# Patient Record
Sex: Female | Born: 1975 | Race: Black or African American | Hispanic: No | Smoking: Never smoker
Health system: Southern US, Community
[De-identification: ages and names within clinical notes are randomized; demographics above are authoritative.]

## PROBLEM LIST (undated history)

## (undated) DIAGNOSIS — Z8719 Personal history of other diseases of the digestive system: Secondary | ICD-10-CM

## (undated) DIAGNOSIS — Z87440 Personal history of urinary (tract) infections: Secondary | ICD-10-CM

## (undated) DIAGNOSIS — D219 Benign neoplasm of connective and other soft tissue, unspecified: Secondary | ICD-10-CM

## (undated) DIAGNOSIS — S39012A Strain of muscle, fascia and tendon of lower back, initial encounter: Secondary | ICD-10-CM

## (undated) DIAGNOSIS — B009 Herpesviral infection, unspecified: Secondary | ICD-10-CM

## (undated) DIAGNOSIS — B379 Candidiasis, unspecified: Secondary | ICD-10-CM

## (undated) DIAGNOSIS — N760 Acute vaginitis: Principal | ICD-10-CM

## (undated) DIAGNOSIS — Z8619 Personal history of other infectious and parasitic diseases: Secondary | ICD-10-CM

## (undated) DIAGNOSIS — B9689 Other specified bacterial agents as the cause of diseases classified elsewhere: Principal | ICD-10-CM

## (undated) DIAGNOSIS — B373 Candidiasis of vulva and vagina: Secondary | ICD-10-CM

## (undated) HISTORY — DX: Strain of muscle, fascia and tendon of lower back, initial encounter: S39.012A

## (undated) HISTORY — DX: Acute vaginitis: N76.0

## (undated) HISTORY — DX: Herpesviral infection, unspecified: B00.9

## (undated) HISTORY — DX: Personal history of urinary (tract) infections: Z87.440

## (undated) HISTORY — DX: Candidiasis of vulva and vagina: B37.3

## (undated) HISTORY — PX: TUBAL LIGATION: SHX77

## (undated) HISTORY — DX: Personal history of other diseases of the digestive system: Z87.19

## (undated) HISTORY — DX: Personal history of other infectious and parasitic diseases: Z86.19

## (undated) HISTORY — DX: Candidiasis, unspecified: B37.9

## (undated) HISTORY — DX: Other specified bacterial agents as the cause of diseases classified elsewhere: B96.89

---

## 1997-06-12 ENCOUNTER — Inpatient Hospital Stay (HOSPITAL_COMMUNITY): Admission: AD | Admit: 1997-06-12 | Discharge: 1997-06-12 | Payer: Self-pay | Admitting: Obstetrics and Gynecology

## 1997-06-25 ENCOUNTER — Ambulatory Visit (HOSPITAL_COMMUNITY): Admission: RE | Admit: 1997-06-25 | Discharge: 1997-06-25 | Payer: Self-pay | Admitting: Obstetrics and Gynecology

## 1997-08-13 ENCOUNTER — Other Ambulatory Visit: Admission: RE | Admit: 1997-08-13 | Discharge: 1997-08-13 | Payer: Self-pay | Admitting: Obstetrics and Gynecology

## 1997-09-30 ENCOUNTER — Other Ambulatory Visit: Admission: RE | Admit: 1997-09-30 | Discharge: 1997-09-30 | Payer: Self-pay | Admitting: Obstetrics and Gynecology

## 1997-11-13 ENCOUNTER — Inpatient Hospital Stay (HOSPITAL_COMMUNITY): Admission: AD | Admit: 1997-11-13 | Discharge: 1997-11-15 | Payer: Self-pay | Admitting: Obstetrics

## 1997-11-13 ENCOUNTER — Inpatient Hospital Stay (HOSPITAL_COMMUNITY): Admission: AD | Admit: 1997-11-13 | Discharge: 1997-11-13 | Payer: Self-pay | Admitting: Obstetrics & Gynecology

## 2000-03-25 ENCOUNTER — Ambulatory Visit (HOSPITAL_COMMUNITY): Admission: RE | Admit: 2000-03-25 | Discharge: 2000-03-25 | Payer: Self-pay | Admitting: *Deleted

## 2001-01-23 ENCOUNTER — Other Ambulatory Visit: Admission: RE | Admit: 2001-01-23 | Discharge: 2001-01-23 | Payer: Self-pay | Admitting: Family Medicine

## 2003-02-21 ENCOUNTER — Other Ambulatory Visit: Admission: RE | Admit: 2003-02-21 | Discharge: 2003-02-21 | Payer: Self-pay | Admitting: Obstetrics and Gynecology

## 2003-04-20 DIAGNOSIS — Z8719 Personal history of other diseases of the digestive system: Secondary | ICD-10-CM

## 2003-04-20 DIAGNOSIS — B379 Candidiasis, unspecified: Secondary | ICD-10-CM

## 2003-04-20 DIAGNOSIS — B9689 Other specified bacterial agents as the cause of diseases classified elsewhere: Secondary | ICD-10-CM

## 2003-04-20 HISTORY — DX: Personal history of other diseases of the digestive system: Z87.19

## 2003-04-20 HISTORY — DX: Candidiasis, unspecified: B37.9

## 2003-04-20 HISTORY — DX: Other specified bacterial agents as the cause of diseases classified elsewhere: B96.89

## 2003-07-24 DIAGNOSIS — B9689 Other specified bacterial agents as the cause of diseases classified elsewhere: Secondary | ICD-10-CM | POA: Insufficient documentation

## 2004-02-27 ENCOUNTER — Other Ambulatory Visit: Admission: RE | Admit: 2004-02-27 | Discharge: 2004-02-27 | Payer: Self-pay | Admitting: Obstetrics and Gynecology

## 2004-04-19 DIAGNOSIS — B009 Herpesviral infection, unspecified: Secondary | ICD-10-CM

## 2004-04-19 HISTORY — DX: Herpesviral infection, unspecified: B00.9

## 2005-02-26 ENCOUNTER — Other Ambulatory Visit: Admission: RE | Admit: 2005-02-26 | Discharge: 2005-02-26 | Payer: Self-pay | Admitting: Obstetrics and Gynecology

## 2005-07-31 ENCOUNTER — Emergency Department (HOSPITAL_COMMUNITY): Admission: EM | Admit: 2005-07-31 | Discharge: 2005-07-31 | Payer: Self-pay | Admitting: Emergency Medicine

## 2005-11-29 ENCOUNTER — Emergency Department (HOSPITAL_COMMUNITY): Admission: EM | Admit: 2005-11-29 | Discharge: 2005-11-29 | Payer: Self-pay | Admitting: Family Medicine

## 2005-12-21 ENCOUNTER — Emergency Department (HOSPITAL_COMMUNITY): Admission: EM | Admit: 2005-12-21 | Discharge: 2005-12-21 | Payer: Self-pay | Admitting: Family Medicine

## 2006-11-30 ENCOUNTER — Emergency Department (HOSPITAL_COMMUNITY): Admission: EM | Admit: 2006-11-30 | Discharge: 2006-11-30 | Payer: Self-pay | Admitting: Emergency Medicine

## 2007-01-18 ENCOUNTER — Emergency Department (HOSPITAL_COMMUNITY): Admission: EM | Admit: 2007-01-18 | Discharge: 2007-01-18 | Payer: Self-pay | Admitting: Emergency Medicine

## 2007-03-21 ENCOUNTER — Emergency Department (HOSPITAL_COMMUNITY): Admission: EM | Admit: 2007-03-21 | Discharge: 2007-03-21 | Payer: Self-pay | Admitting: Emergency Medicine

## 2007-06-07 ENCOUNTER — Emergency Department (HOSPITAL_COMMUNITY): Admission: EM | Admit: 2007-06-07 | Discharge: 2007-06-07 | Payer: Self-pay | Admitting: Family Medicine

## 2008-01-28 ENCOUNTER — Emergency Department (HOSPITAL_COMMUNITY): Admission: EM | Admit: 2008-01-28 | Discharge: 2008-01-28 | Payer: Self-pay | Admitting: Family Medicine

## 2009-01-27 ENCOUNTER — Emergency Department (HOSPITAL_COMMUNITY): Admission: EM | Admit: 2009-01-27 | Discharge: 2009-01-27 | Payer: Self-pay | Admitting: Family Medicine

## 2009-04-26 ENCOUNTER — Emergency Department (HOSPITAL_COMMUNITY): Admission: EM | Admit: 2009-04-26 | Discharge: 2009-04-26 | Payer: Self-pay | Admitting: Family Medicine

## 2010-07-23 LAB — WET PREP, GENITAL
Clue Cells Wet Prep HPF POC: NONE SEEN
Trich, Wet Prep: NONE SEEN
Yeast Wet Prep HPF POC: NONE SEEN

## 2010-07-23 LAB — POCT URINALYSIS DIP (DEVICE)
Glucose, UA: NEGATIVE mg/dL
Nitrite: NEGATIVE
Specific Gravity, Urine: 1.01 (ref 1.005–1.030)
Urobilinogen, UA: 0.2 mg/dL (ref 0.0–1.0)
pH: 5.5 (ref 5.0–8.0)

## 2010-07-23 LAB — POCT PREGNANCY, URINE: Preg Test, Ur: NEGATIVE

## 2010-07-23 LAB — GC/CHLAMYDIA PROBE AMP, GENITAL
Chlamydia, DNA Probe: NEGATIVE
GC Probe Amp, Genital: NEGATIVE

## 2010-09-04 NOTE — Op Note (Signed)
[facility]  Patient:    Monique Lindsey, Monique Lindsey                    MRN: [id number] Proc. Date: 03/25/00 Adm. Date:  [id number] Disc. Date: [id number] Attending:  Pleas Koch                           Operative Report  PREOPERATIVE DIAGNOSES:       Desires elective sterilization and desires Norplant removal.  POSTOPERATIVE DIAGNOSIS:      Desires elective sterilization and desires Norplant removal.  OPERATION:                    Laparoscopic tubal cautery with Norplant removal.  SURGEON:                      Georgina Peer, M.D.  ANESTHESIA:                   General plus local injection of the skin with 0.25% Marcaine plain.  ESTIMATED BLOOD LOSS:         Less than 25 cc.  FINDINGS:                     Normal pelvis. Normal Norplant.  COMPLICATIONS:                None.  INDICATIONS:                  A 35 year old black female desired permanent sterilization. Had Norplant for contraception but because of Norplant recall, desired removal and permanent sterilization by laparoscopic cautery. She is aware of risks and complications of laparoscopy including bowel, bladder, vascular injury and also the 1% to 2% lifetime failure rate which goes along with laparoscopic tubal. She accepted these and was willing to proceed.  DESCRIPTION OF PROCEDURE:     Patient was taken to the operating room. She was placed in dorsal lithotomy position. The patient was prepped and draped in the normal sterile fashion. Bladder was emptied with a catheter. Pelvic exam was normal. Her uterine manipulator was placed in through the cervix. Other instruments removed. Vertical subumbilical incision was made and 2.5 liters of carbon dioxide gas was insufflated creating a pneumoperitoneum. Laparoscopic trocar and sleeve were introduced, under direct vision the tubes were identified, traced to their fimbriated ends. Midportion knuckle was elevated and bipolar cautery was  applied to the midportion segment among a 2-cm portion until ______ , blanching and no current passed through the tubes. This was done bilaterally and photo documentation was made. The tubes and ovaries looked normal. The uterus looked normal. The peritoneal surfaces looked normal. The bowel looked normal. The appendix was not visualized. The upper abdomen looked normal and there was no apparent injury from laparoscopic trocar placement. At the end of this procedure, the trocar was removed, Marcaine was placed in the subumbilical incision. It was closed with 4-0 subcuticular Dexon.  The patients left upper arm was then prepped and draped in the normal sterile fashion. Six Norplant cylinders were identified. Marcaine, total of 9 cc, was injected under the Norplant system and a small incision was made at the base of the cylinders and cylinders were dissected free from their capsule and excised, six in total were identified and removed. Dermabond was placed over the incision and upper arm was sterilely wrapped with Kerlix. Sponge, needle,  and instrument counts were correct. The vaginal instrument was removed and the patient returned to recovery area in stable condition. She received Toradol IV. DD:  03/25/00 TD:  03/25/00 Job: [id number]

## 2010-11-17 DIAGNOSIS — B373 Candidiasis of vulva and vagina: Secondary | ICD-10-CM

## 2010-11-17 DIAGNOSIS — B3731 Acute candidiasis of vulva and vagina: Secondary | ICD-10-CM

## 2010-11-17 DIAGNOSIS — S39012A Strain of muscle, fascia and tendon of lower back, initial encounter: Secondary | ICD-10-CM

## 2010-11-17 HISTORY — DX: Strain of muscle, fascia and tendon of lower back, initial encounter: S39.012A

## 2010-11-17 HISTORY — DX: Candidiasis of vulva and vagina: B37.3

## 2010-11-17 HISTORY — DX: Acute candidiasis of vulva and vagina: B37.31

## 2011-01-08 LAB — POCT URINALYSIS DIP (DEVICE)
Glucose, UA: NEGATIVE
Nitrite: NEGATIVE
Operator id: 239701
Urobilinogen, UA: 0.2
pH: 6

## 2011-01-19 LAB — WET PREP, GENITAL: Trich, Wet Prep: NONE SEEN

## 2011-01-19 LAB — GC/CHLAMYDIA PROBE AMP, GENITAL: Chlamydia, DNA Probe: NEGATIVE

## 2011-01-25 LAB — POCT URINALYSIS DIP (DEVICE)
Operator id: 208841
Specific Gravity, Urine: 1.02
pH: 7.5

## 2011-01-25 LAB — POCT PREGNANCY, URINE: Operator id: 235561

## 2011-01-25 LAB — GC/CHLAMYDIA PROBE AMP, GENITAL: GC Probe Amp, Genital: NEGATIVE

## 2011-01-28 LAB — GC/CHLAMYDIA PROBE AMP, GENITAL
Chlamydia, DNA Probe: NEGATIVE
GC Probe Amp, Genital: NEGATIVE

## 2011-01-28 LAB — WET PREP, GENITAL
Trich, Wet Prep: NONE SEEN
Yeast Wet Prep HPF POC: NONE SEEN

## 2011-01-28 LAB — POCT URINALYSIS DIP (DEVICE)
Bilirubin Urine: NEGATIVE
Nitrite: NEGATIVE
Operator id: 247071
Specific Gravity, Urine: <=1.005
Urobilinogen, UA: 0.2

## 2011-01-28 LAB — POCT PREGNANCY, URINE: Operator id: 239701

## 2011-06-24 ENCOUNTER — Encounter: Payer: Self-pay | Admitting: Registered Nurse

## 2011-06-30 ENCOUNTER — Encounter (INDEPENDENT_AMBULATORY_CARE_PROVIDER_SITE_OTHER): Payer: BC Managed Care – PPO | Admitting: Registered Nurse

## 2011-06-30 DIAGNOSIS — R109 Unspecified abdominal pain: Secondary | ICD-10-CM

## 2011-07-15 ENCOUNTER — Encounter (INDEPENDENT_AMBULATORY_CARE_PROVIDER_SITE_OTHER): Payer: BC Managed Care – PPO | Admitting: Registered Nurse

## 2011-07-15 DIAGNOSIS — N760 Acute vaginitis: Secondary | ICD-10-CM

## 2011-07-15 DIAGNOSIS — N72 Inflammatory disease of cervix uteri: Secondary | ICD-10-CM

## 2011-07-15 DIAGNOSIS — M545 Low back pain, unspecified: Secondary | ICD-10-CM

## 2011-07-15 DIAGNOSIS — N949 Unspecified condition associated with female genital organs and menstrual cycle: Secondary | ICD-10-CM

## 2011-07-15 DIAGNOSIS — Z13820 Encounter for screening for osteoporosis: Secondary | ICD-10-CM

## 2011-08-12 ENCOUNTER — Ambulatory Visit: Payer: BC Managed Care – PPO | Admitting: Obstetrics and Gynecology

## 2011-10-06 DIAGNOSIS — N760 Acute vaginitis: Secondary | ICD-10-CM

## 2011-10-11 ENCOUNTER — Ambulatory Visit: Payer: BC Managed Care – PPO | Admitting: Obstetrics and Gynecology

## 2011-10-11 ENCOUNTER — Other Ambulatory Visit: Payer: Self-pay | Admitting: Obstetrics and Gynecology

## 2011-10-11 NOTE — Telephone Encounter (Signed)
Triage/epic 

## 2011-10-12 ENCOUNTER — Other Ambulatory Visit: Payer: Self-pay

## 2011-10-12 MED ORDER — VALACYCLOVIR HCL 500 MG PO TABS: 500.0000 mg | ORAL_TABLET | Freq: Every day | ORAL | Status: DC

## 2011-10-12 NOTE — Telephone Encounter (Signed)
Lm on vm rx sent to pharm per protocol 

## 2011-11-09 ENCOUNTER — Encounter: Payer: Self-pay | Admitting: Obstetrics and Gynecology

## 2011-11-09 ENCOUNTER — Ambulatory Visit (INDEPENDENT_AMBULATORY_CARE_PROVIDER_SITE_OTHER): Payer: BC Managed Care – PPO | Admitting: Obstetrics and Gynecology

## 2011-11-09 VITALS — BP 100/70 | HR 74 | Ht 64.0 in | Wt 176.0 lb

## 2011-11-09 DIAGNOSIS — Z124 Encounter for screening for malignant neoplasm of cervix: Secondary | ICD-10-CM

## 2011-11-09 DIAGNOSIS — Z01419 Encounter for gynecological examination (general) (routine) without abnormal findings: Secondary | ICD-10-CM

## 2011-11-09 DIAGNOSIS — Z113 Encounter for screening for infections with a predominantly sexual mode of transmission: Secondary | ICD-10-CM

## 2011-11-09 LAB — POCT WET PREP (WET MOUNT): pH: 5.5

## 2011-11-09 MED ORDER — TINIDAZOLE 500 MG PO TABS: ORAL_TABLET | ORAL | Status: DC

## 2011-11-09 MED ORDER — TINIDAZOLE 500 MG PO TABS: 2.0000 g | ORAL_TABLET | Freq: Once | ORAL | Status: DC

## 2011-11-09 NOTE — Patient Instructions (Addendum)
Avoid: - excess soap on genital area (consider using plain oatmeal soap) - use of powder or sprays in genital area - douching - wearing underwear to bed (except with menses) - using more than is directed detergent when washing clothes - tight fitting garments around genital area - excess sugar intake  Bacterial Vaginosis Bacterial vaginosis (BV) is a vaginal infection where the normal balance of bacteria in the vagina is disrupted. The normal balance is then replaced by an overgrowth of certain bacteria. There are several different kinds of bacteria that can cause BV. BV is the most common vaginal infection in women of childbearing age. CAUSES   The cause of BV is not fully understood. BV develops when there is an increase or imbalance of harmful bacteria.   Some activities or behaviors can upset the normal balance of bacteria in the vagina and put women at increased risk including:   Having a new sex partner or multiple sex partners.   Douching.   Using an intrauterine device (IUD) for contraception.   It is not clear what role sexual activity plays in the development of BV. However, women that have never had sexual intercourse are rarely infected with BV.  Women do not get BV from toilet seats, bedding, swimming pools or from touching objects around them.  SYMPTOMS   Grey vaginal discharge.   A fish-like odor with discharge, especially after sexual intercourse.   Itching or burning of the vagina and vulva.   Burning or pain with urination.   Some women have no signs or symptoms at all.  DIAGNOSIS  Your caregiver must examine the vagina for signs of BV. Your caregiver will perform lab tests and look at the sample of vaginal fluid through a microscope. They will look for bacteria and abnormal cells (clue cells), a pH test higher than 4.5, and a positive amine test all associated with BV.  RISKS AND COMPLICATIONS   Pelvic inflammatory disease (PID).   Infections following  gynecology surgery.   Developing HIV.   Developing herpes virus.  TREATMENT  Sometimes BV will clear up without treatment. However, all women with symptoms of BV should be treated to avoid complications, especially if gynecology surgery is planned. Female partners generally do not need to be treated. However, BV may spread between female sex partners so treatment is helpful in preventing a recurrence of BV.   BV may be treated with antibiotics. The antibiotics come in either pill or vaginal cream forms. Either can be used with nonpregnant or pregnant women, but the recommended dosages differ. These antibiotics are not harmful to the baby.   BV can recur after treatment. If this happens, a second round of antibiotics will often be prescribed.   Treatment is important for pregnant women. If not treated, BV can cause a premature delivery, especially for a pregnant woman who had a premature birth in the past. All pregnant women who have symptoms of BV should be checked and treated.   For chronic reoccurrence of BV, treatment with a type of prescribed gel vaginally twice a week is helpful.  HOME CARE INSTRUCTIONS   Finish all medication as directed by your caregiver.   Do not have sex until treatment is completed.   Tell your sexual partner that you have a vaginal infection. They should see their caregiver and be treated if they have problems, such as a mild rash or itching.   Practice safe sex. Use condoms. Only have 1 sex partner.  PREVENTION  Basic   prevention steps can help reduce the risk of upsetting the natural balance of bacteria in the vagina and developing BV:  Do not have sexual intercourse (be abstinent).   Do not douche.   Use all of the medicine prescribed for treatment of BV, even if the signs and symptoms go away.   Tell your sex partner if you have BV. That way, they can be treated, if needed, to prevent reoccurrence.  SEEK MEDICAL CARE IF:   Your symptoms are not  improving after 3 days of treatment.   You have increased discharge, pain, or fever.  MAKE SURE YOU:   Understand these instructions.   Will watch your condition.   Will get help right away if you are not doing well or get worse.  FOR MORE INFORMATION  Division of STD Prevention (DSTDP), Centers for Disease Control and Prevention: www.cdc.gov/std American Social Health Association (ASHA): www.ashastd.org  Document Released: 04/05/2005 Document Revised: 03/25/2011 Document Reviewed: 09/26/2008 ExitCare Patient Information 2012 [employer]. 

## 2011-11-09 NOTE — Progress Notes (Signed)
Regular Periods: yes Mammogram: no  Monthly Breast Ex.: yes Exercise: yes  Tetanus < 10 years: no Seatbelts: yes  NI. Bladder Functn.: yes Abuse at home: no  Daily BM's: no Stressful Work: no  Healthy Diet: yes Sigmoid-Colonoscopy: NO  Calcium: yes Medical problems this year: WANT TO BE CHECKED FOR YEAST   LAST PAP:4/12  Contraception: BTL  Mammogram:  NO  PCP: NO  PMH: NO CHANGE  FMH: NO CHANGE  Last Bone Scan: NO

## 2011-11-09 NOTE — Progress Notes (Signed)
Subjective:    Monique Lindsey is a 36 y.o. female, G2P1102, who presents for an annual exam. The patient requests STD tests and wet prep for yeast though she doesn't have  any symptoms now but last week felt irritated.  Menstrual cycle:   LMP: Patient's last menstrual period was 10/20/2011.             Review of Systems Pertinent items are noted in HPI. Denies pelvic pain, urinary tract symptoms, vaginitis symptoms, irregular bleeding, menopausal symptoms, change in bowel habits or rectal bleeding   Objective:    Ht 5\' 4"  (1.626 m)  Wt 176 lb (79.833 kg)  BMI 30.21 kg/m2  LMP 10/20/2011    Wt Readings from Last 1 Encounters:  11/09/11 176 lb (79.833 kg)   Body mass index is 30.21 kg/(m^2). General Appearance: Alert, no acute distress HEENT: Grossly normal Neck / Thyroid: Supple, no thyromegaly or cervical adenopathy Lungs: Clear to auscultation bilaterally Back: No CVA tenderness Breast Exam: No masses or nodes.No dimpling, nipple retraction or discharge. Cardiovascular: Regular rate and rhythm.  Gastrointestinal: Soft, non-tender, no masses or organomegaly Pelvic Exam: EGBUS-wnl, vagina-normal rugae, cervix- without lesions or tenderness, uterus appears normal size shape and consistency, adnexae-no masses or tenderness Lymphatic Exam: Non-palpable nodes in neck, clavicular,  axillary, or inguinal regions  Skin: no rashes or abnormalities Extremities: no clubbing cyanosis or edema  Neurologic: grossly normal Psychiatric: Alert and oriented  Wet Prep: pH 5.5,  whiff-positive, clue +   Assessment:   Routine GYN Exam Bacterial Vaginosis   Plan:  STD testing  Tinidazole 500 mg # 8 4 po qd x 2 days no refills;  Etoh precautions  PAP sent  RTO 1 year or prn  Illeana Edick,ELMIRAPA-C

## 2011-11-10 LAB — RPR

## 2011-11-11 LAB — PAP IG, CT-NG, RFX HPV ASCU: Chlamydia Probe Amp: NEGATIVE

## 2011-11-28 ENCOUNTER — Other Ambulatory Visit: Payer: Self-pay | Admitting: Obstetrics and Gynecology

## 2011-12-01 ENCOUNTER — Other Ambulatory Visit: Payer: Self-pay | Admitting: Obstetrics and Gynecology

## 2011-12-01 ENCOUNTER — Other Ambulatory Visit: Payer: Self-pay

## 2011-12-01 MED ORDER — VALACYCLOVIR HCL 500 MG PO TABS: 500.0000 mg | ORAL_TABLET | Freq: Every day | ORAL | Status: AC

## 2011-12-01 NOTE — Telephone Encounter (Signed)
Lm on vm rx sent to pharm per protocol

## 2012-02-21 ENCOUNTER — Other Ambulatory Visit: Payer: Self-pay | Admitting: Obstetrics and Gynecology

## 2012-04-20 ENCOUNTER — Telehealth: Payer: Self-pay | Admitting: Obstetrics and Gynecology

## 2012-04-21 MED ORDER — VALACYCLOVIR HCL 500 MG PO TABS: 500.0000 mg | ORAL_TABLET | Freq: Every day | ORAL | Status: DC

## 2012-04-21 NOTE — Telephone Encounter (Signed)
Rx sent to pharmacy   

## 2012-06-07 ENCOUNTER — Other Ambulatory Visit: Payer: Self-pay | Admitting: Obstetrics and Gynecology

## 2012-06-07 MED ORDER — VALACYCLOVIR HCL 500 MG PO TABS
500.0000 mg | ORAL_TABLET | Freq: Every day | ORAL | Status: DC
Start: 2012-06-07 — End: 2022-09-12

## 2012-06-07 NOTE — Telephone Encounter (Signed)
rx refill for valtrex 500 mg sig 1 po qd with 1 refill.

## 2012-06-08 ENCOUNTER — Encounter: Payer: Self-pay | Admitting: Obstetrics and Gynecology

## 2012-06-08 NOTE — Progress Notes (Unsigned)
FAx received for RF Valacyclovir 500mg  1 po qd #30.Marland Kitchen Pt had annual 10/2011.  Per protocol. Rx for Rf until 11/08/12 faxed to [facility].

## 2012-06-14 ENCOUNTER — Telehealth: Payer: Self-pay | Admitting: Family Medicine

## 2012-06-15 ENCOUNTER — Encounter: Payer: Self-pay | Admitting: Family Medicine

## 2013-07-04 ENCOUNTER — Other Ambulatory Visit (HOSPITAL_COMMUNITY)
Admission: RE | Admit: 2013-07-04 | Discharge: 2013-07-04 | Disposition: A | Discharge status: Home / Self Care | Payer: BC Managed Care – PPO | Source: Ambulatory Visit | Attending: Family Medicine | Admitting: Family Medicine

## 2013-07-04 DIAGNOSIS — R87810 Cervical high risk human papillomavirus (HPV) DNA test positive: Secondary | ICD-10-CM | POA: Insufficient documentation

## 2013-07-04 DIAGNOSIS — Z124 Encounter for screening for malignant neoplasm of cervix: Secondary | ICD-10-CM | POA: Insufficient documentation

## 2013-07-04 DIAGNOSIS — Z1151 Encounter for screening for human papillomavirus (HPV): Secondary | ICD-10-CM | POA: Insufficient documentation

## 2013-12-19 ENCOUNTER — Other Ambulatory Visit: Payer: Self-pay | Admitting: Obstetrics and Gynecology

## 2013-12-19 DIAGNOSIS — D259 Leiomyoma of uterus, unspecified: Secondary | ICD-10-CM

## 2013-12-25 ENCOUNTER — Other Ambulatory Visit: Payer: Self-pay | Admitting: Obstetrics and Gynecology

## 2013-12-25 DIAGNOSIS — D259 Leiomyoma of uterus, unspecified: Secondary | ICD-10-CM

## 2013-12-27 ENCOUNTER — Ambulatory Visit
Admission: RE | Admit: 2013-12-27 | Discharge: 2013-12-27 | Disposition: A | Discharge status: Home / Self Care | Payer: BC Managed Care – PPO | Source: Ambulatory Visit | Attending: Obstetrics and Gynecology | Admitting: Obstetrics and Gynecology

## 2013-12-27 DIAGNOSIS — D259 Leiomyoma of uterus, unspecified: Secondary | ICD-10-CM

## 2013-12-27 NOTE — Consult Note (Signed)
Chief Complaint  Patient presents with  . Advice Only    Consult for [place]       Referring Physician(s): Cousins,Sheronette A  History of Present Illness:  Monique Lindsey is a 38 y.o. (G2, P2) female was initially diagnosed with uterine fibroids approximately 3 months ago pelvic ultrasound performed at [facility] on 10/17/2013. Per report, the pelvic ultrasound demonstrates multiple uterine fibroids, the largest of which measures approximately 6.7 cm and displaces the endometrium. The patient presents to interventional radiology clinic for evaluation of potential percutaneous treatment options for her symptomatically uterine fibroids. The patient is unaccompanied and serves as her own historian.  The patient states that her cycle remains regular though she has noticed worsening menorrhagia during the past year. The patient states her cycle lasts approximately 5 date, the middle 2 days of which are associated with heavy bleeding sometimes necessitating the changing of pads every 30 minutes and occasionally necessitating her having to wear 2 pads. Patient denies definitive passage of clots. She reports very minimal spotting, primarily preceding her otherwise normal cycle. Patient denies postmenopausal symptoms.  The patient does admit to bulk symptoms including bloating, right-sided lower abdominal/pelvic pain, frequent urination and occasional dysparenuria. She denies hematuria, constipation, bloody or melanotic stools.  The patient states her overall health is good. She states she has recently begun exercising and has improved her diet with subsequent loss of approximately 15-20 pounds. The patient does not desire any future children. The patient is not interested in undergoing a surgical procedure at the present time.  Note, the patient's sister underwent a myomectomy in her 79s.  Past Medical History  Diagnosis Date  . Preterm labor   . H/O varicella     Age 18  . Hx: UTI (urinary  tract infection)     Frequently  . BV (bacterial vaginosis) 2005  . H/O hemorrhoids 2005  . Monilia infection 2005  . HSV-2 infection 2006  . Candida vaginitis 11/17/10  . Back strain 11/17/10    Past Surgical History  Procedure Laterality Date  . Tubal ligation      Allergies: Review of patient's allergies indicates no known allergies.  Medications: Prior to Admission medications   Medication Sig Start Date End Date Taking? Authorizing Provider  tinidazole (TINDAMAX) 500 MG tablet 4 tablets with food and 8 ounces of water daily x 2 days 11/09/11   Earnstine Regal, PA-C  valACYclovir (VALTREX) 500 MG tablet Take 1 tablet (500 mg total) by mouth daily. 06/07/12   Ena Dawley, MD    Family History  Problem Relation Age of Onset  . Hypertension Father     History   Social History  . Marital Status: Divorced    Spouse Name: N/A    Number of Children: N/A  . Years of Education: N/A   Social History Main Topics  . Smoking status: Never Smoker   . Smokeless tobacco: Never Used  . Alcohol Use: Yes     Comment: OCCASIONAL wine    . Drug Use: No  . Sexual Activity: Yes    Birth Control/ Protection: Surgical     Comment: BTL   Other Topics Concern  . None   Social History Narrative  . None    ECOG Status: 0 - Asymptomatic  Review of Systems: A 12 point ROS discussed and pertinent positives are indicated in the HPI above.  All other systems are negative.  Review of Systems  Constitutional: Negative.   HENT: Positive for facial  swelling.   Eyes: Negative.   Respiratory: Negative.   Cardiovascular: Negative.   Gastrointestinal: Negative.   Genitourinary: Positive for frequency.  Neurological: Negative.   Psychiatric/Behavioral: Negative.     Vital Signs: Ht 5\' 4"  (1.626 m)  Wt 158 lb (71.668 kg)  BMI 27.11 kg/m2  LMP 12/06/2013  Physical Exam  Constitutional: She is oriented to person, place, and time. She appears well-developed and well-nourished.    HENT:  Head: Normocephalic and atraumatic.  Cardiovascular: Normal rate and intact distal pulses.   Pulmonary/Chest: Effort normal.  Abdominal: Soft. Bowel sounds are normal.  I am unable to palpable the uterine fundus  Neurological: She is alert and oriented to person, place, and time.  Skin: Skin is warm and dry.  Psychiatric: She has a normal mood and affect.    Imaging:  Pelvic ultrasound performed at [facility]-[id number] -- report only, no images; report demonstrates multiple uterine fibroids, the largest of which involving the posterior aspect of the uterus measures approximately 6.7 cm in greatest diameter and displaces the endometrial stripe. Ovaries are reportedly normal.  Labs:  Endometrial biopsy: Negative (performed a test dose of 2015.  Pap smear: Negative (performed 07/05/2013).  All laboratory values as of 05/24/2013 [place] otherwise specified: Creatinine -- 0.89 Hemoglobin -- 12. Hematocrit -- 35.3 Platelets -- 340  Assessment and Plan:  Monique Lindsey is a 38 y.o. (G2, P2) female was initially diagnosed with uterine fibroids approximately 3 months ago pelvic ultrasound performed at [place] on 10/17/2013. The patient admits T. symptoms attributable to her uterine fibroids including worsening menorrhagia during the past year as well as bulk symptoms including bloating, right-sided lower abdominal/pelvic pain, frequent urination and occasional dysparenuria. The patient denies perimenopausal symptoms and does not desire any future children.  Prolonged discussions were held with the patient regarding options for her symptomatic uterine fibroids including conservative management, hysterectomy and uterine fibroid embolization. At the present time, the patient is not interested in undergoing a surgical procedure, though does wish to pursue treatment for her fibroids.  As such, prolonged discussions were held with the patient regarding the benefits and risks  (including but not limited to bleeding, vessel injury, infection, nontarget embolization, contrast or radiation exposure) for uterine fibroid embolization. Following this discussion, the patient wishes to pursue a uterine fibroid embolization, likely in January of next year (as she has very little vacation or sick leave saved for the remainder of this year).  As such, I will obtain a contrast enhanced pelvic MRI likely sometime in November. Following the acquisition of this MRI, the patient will either be scheduled for a uterine fibroid embolization and/or she will return to clinic for discussion of the results of the MRI per her request.    The uterine fibroid embolization will ultimately performed at [facility] and will entail an overnight admission for PCA usage and observation.  Thank you for this interesting consult.  I greatly enjoyed meeting Monique Lindsey and look forward to participating in their care.  Ronny Bacon, MD   I spent a total of 45 minutes face to face in clinical consultation, greater than 50% of which was counseling/coordinating care for symptomatic uterine fibroids

## 2014-03-12 ENCOUNTER — Ambulatory Visit
Admission: RE | Admit: 2014-03-12 | Discharge: 2014-03-12 | Disposition: A | Discharge status: Home / Self Care | Payer: BC Managed Care – PPO | Source: Ambulatory Visit | Attending: Obstetrics and Gynecology | Admitting: Obstetrics and Gynecology

## 2014-03-12 DIAGNOSIS — D259 Leiomyoma of uterus, unspecified: Secondary | ICD-10-CM

## 2014-03-12 MED ORDER — GADOBENATE DIMEGLUMINE 529 MG/ML IV SOLN
14.0000 mL | Freq: Once | INTRAVENOUS | Status: AC | PRN
  Administered 2014-03-12: 14 mL via INTRAVENOUS

## 2014-03-20 ENCOUNTER — Telehealth: Payer: Self-pay | Admitting: Emergency Medicine

## 2014-03-20 ENCOUNTER — Telehealth: Payer: Self-pay | Admitting: Radiology

## 2014-03-20 NOTE — Telephone Encounter (Signed)
Left message requesting patient call to review MR results (pre [place] screening).  Sabrina Arriaga Riki Rusk, RN 03/20/2014 10:56 AM

## 2014-03-20 NOTE — Telephone Encounter (Signed)
PT CALLED BACK AND IS READY TO SET UP [place].  WILL CK ON HER INS. AND HAVE TIFFANY AT [facility] -[facility] TO CALL HER TO SET UP DATE AND TIME FOR PROCEDURE.

## 2014-03-22 ENCOUNTER — Other Ambulatory Visit: Payer: Self-pay | Admitting: Interventional Radiology

## 2014-03-22 DIAGNOSIS — D259 Leiomyoma of uterus, unspecified: Secondary | ICD-10-CM

## 2014-04-05 ENCOUNTER — Telehealth: Payer: Self-pay | Admitting: Emergency Medicine

## 2014-04-05 NOTE — Telephone Encounter (Signed)
error 

## 2014-04-08 ENCOUNTER — Other Ambulatory Visit: Payer: Self-pay | Admitting: Radiology

## 2014-04-09 ENCOUNTER — Other Ambulatory Visit (HOSPITAL_COMMUNITY): Payer: BC Managed Care – PPO

## 2014-04-11 ENCOUNTER — Other Ambulatory Visit: Payer: Self-pay | Admitting: Radiology

## 2014-04-15 ENCOUNTER — Observation Stay (HOSPITAL_COMMUNITY)
Admission: AD | Admit: 2014-04-15 | Discharge: 2014-04-16 | Disposition: A | Discharge status: Home / Self Care | Payer: BC Managed Care – PPO | Source: Ambulatory Visit | Attending: Interventional Radiology | Admitting: Interventional Radiology

## 2014-04-15 ENCOUNTER — Ambulatory Visit (HOSPITAL_COMMUNITY)
Admission: RE | Admit: 2014-04-15 | Discharge: 2014-04-15 | Disposition: A | Discharge status: Home / Self Care | Payer: BC Managed Care – PPO | Source: Ambulatory Visit | Attending: Interventional Radiology | Admitting: Interventional Radiology

## 2014-04-15 ENCOUNTER — Encounter (HOSPITAL_COMMUNITY): Payer: Self-pay

## 2014-04-15 VITALS — BP 87/44 | HR 68 | Temp 99.1°F | Resp 14 | Ht 64.0 in | Wt 154.0 lb

## 2014-04-15 DIAGNOSIS — Z9851 Tubal ligation status: Secondary | ICD-10-CM | POA: Diagnosis not present

## 2014-04-15 DIAGNOSIS — Z87440 Personal history of urinary (tract) infections: Secondary | ICD-10-CM | POA: Diagnosis not present

## 2014-04-15 DIAGNOSIS — Z8249 Family history of ischemic heart disease and other diseases of the circulatory system: Secondary | ICD-10-CM | POA: Insufficient documentation

## 2014-04-15 DIAGNOSIS — D251 Intramural leiomyoma of uterus: Secondary | ICD-10-CM

## 2014-04-15 DIAGNOSIS — D219 Benign neoplasm of connective and other soft tissue, unspecified: Secondary | ICD-10-CM | POA: Diagnosis present

## 2014-04-15 DIAGNOSIS — D259 Leiomyoma of uterus, unspecified: Principal | ICD-10-CM | POA: Insufficient documentation

## 2014-04-15 HISTORY — PX: UTERINE ARTERY EMBOLIZATION: SHX2629

## 2014-04-15 LAB — CBC WITH DIFFERENTIAL/PLATELET
BASOS ABS: 0 10*3/uL (ref 0.0–0.1)
BASOS PCT: 1 % (ref 0–1)
EOS PCT: 1 % (ref 0–5)
Eosinophils Absolute: 0.1 10*3/uL (ref 0.0–0.7)
HCT: 34.1 % — ABNORMAL LOW (ref 36.0–46.0)
Hemoglobin: 11.2 g/dL — ABNORMAL LOW (ref 12.0–15.0)
LYMPHS PCT: 36 % (ref 12–46)
Lymphs Abs: 2.3 10*3/uL (ref 0.7–4.0)
MCH: 28.5 pg (ref 26.0–34.0)
MCHC: 32.8 g/dL (ref 30.0–36.0)
MCV: 86.8 fL (ref 78.0–100.0)
MONO ABS: 0.5 10*3/uL (ref 0.1–1.0)
Monocytes Relative: 7 % (ref 3–12)
Neutro Abs: 3.6 10*3/uL (ref 1.7–7.7)
Neutrophils Relative %: 55 % (ref 43–77)
PLATELETS: 286 10*3/uL (ref 150–400)
RBC: 3.93 MIL/uL (ref 3.87–5.11)
RDW: 14.7 % (ref 11.5–15.5)
WBC: 6.4 10*3/uL (ref 4.0–10.5)

## 2014-04-15 LAB — BASIC METABOLIC PANEL
Anion gap: 8 (ref 5–15)
BUN: 10 mg/dL (ref 6–23)
CO2: 20 mmol/L (ref 19–32)
Calcium: 8.7 mg/dL (ref 8.4–10.5)
Chloride: 108 mEq/L (ref 96–112)
Creatinine, Ser: 0.84 mg/dL (ref 0.50–1.10)
GFR calc Af Amer: >90 mL/min (ref >90)
GFR, EST NON AFRICAN AMERICAN: 86 mL/min — AB (ref >90)
Glucose, Bld: 77 mg/dL (ref 70–99)
POTASSIUM: 3.6 mmol/L (ref 3.5–5.1)
SODIUM: 136 mmol/L (ref 135–145)

## 2014-04-15 LAB — HCG, SERUM, QUALITATIVE: PREG SERUM: NEGATIVE

## 2014-04-15 LAB — PROTIME-INR
INR: 1.04 (ref 0.00–1.49)
Prothrombin Time: 13.7 seconds (ref 11.6–15.2)

## 2014-04-15 LAB — GLUCOSE, CAPILLARY: Glucose-Capillary: 78 mg/dL (ref 70–99)

## 2014-04-15 MED ORDER — ONDANSETRON HCL 4 MG/2ML IJ SOLN
4.0000 mg | Freq: Four times a day (QID) | INTRAMUSCULAR | Status: DC | PRN
  Administered 2014-04-15 – 2014-04-16 (×2): 4 mg via INTRAVENOUS
  Filled 2014-04-15 (×2): qty 2

## 2014-04-15 MED ORDER — MIDAZOLAM HCL 2 MG/2ML IJ SOLN
INTRAMUSCULAR | Status: AC | PRN
  Administered 2014-04-15 (×3): 0.5 mg via INTRAVENOUS
  Administered 2014-04-15: 1 mg via INTRAVENOUS
  Administered 2014-04-15 (×2): 0.5 mg via INTRAVENOUS
  Administered 2014-04-15: 1 mg via INTRAVENOUS

## 2014-04-15 MED ORDER — IOHEXOL 300 MG/ML  SOLN
50.0000 mL | Freq: Once | INTRAMUSCULAR | Status: AC | PRN
  Administered 2014-04-15: 1 mL

## 2014-04-15 MED ORDER — FENTANYL CITRATE 0.05 MG/ML IJ SOLN
INTRAMUSCULAR | Status: AC
  Filled 2014-04-15: qty 4

## 2014-04-15 MED ORDER — HYDROMORPHONE 0.3 MG/ML IV SOLN
INTRAVENOUS | Status: DC
  Administered 2014-04-15: 16:00:00 via INTRAVENOUS
  Administered 2014-04-15: 0.4 mg via INTRAVENOUS
  Administered 2014-04-15: 1.59 mg via INTRAVENOUS
  Administered 2014-04-16: 1.19 mg via INTRAVENOUS
  Administered 2014-04-16: 0.2 mg via INTRAVENOUS
  Administered 2014-04-16: 0.799 mg via INTRAVENOUS

## 2014-04-15 MED ORDER — HYDROCODONE-ACETAMINOPHEN 5-325 MG PO TABS
1.0000 | ORAL_TABLET | ORAL | Status: DC | PRN
  Administered 2014-04-16 (×3): 1 via ORAL
  Filled 2014-04-15 (×3): qty 1

## 2014-04-15 MED ORDER — KETOROLAC TROMETHAMINE 30 MG/ML IJ SOLN
30.0000 mg | Freq: Once | INTRAMUSCULAR | Status: AC
  Administered 2014-04-15: 30 mg via INTRAVENOUS

## 2014-04-15 MED ORDER — LIDOCAINE HCL 1 % IJ SOLN
INTRAMUSCULAR | Status: AC
  Filled 2014-04-15: qty 20

## 2014-04-15 MED ORDER — FENTANYL CITRATE 0.05 MG/ML IJ SOLN
INTRAMUSCULAR | Status: AC | PRN
  Administered 2014-04-15: 50 ug via INTRAVENOUS
  Administered 2014-04-15 (×2): 25 ug via INTRAVENOUS

## 2014-04-15 MED ORDER — SODIUM CHLORIDE 0.9 % IJ SOLN: 9.0000 mL | INTRAMUSCULAR | Status: DC | PRN

## 2014-04-15 MED ORDER — CEFAZOLIN SODIUM-DEXTROSE 2-3 GM-% IV SOLR
2.0000 g | Freq: Once | INTRAVENOUS | Status: AC
  Administered 2014-04-15: 2 g via INTRAVENOUS

## 2014-04-15 MED ORDER — MIDAZOLAM HCL 2 MG/2ML IJ SOLN
INTRAMUSCULAR | Status: AC
  Filled 2014-04-15: qty 6

## 2014-04-15 MED ORDER — HYDROMORPHONE HCL 1 MG/ML IJ SOLN
INTRAMUSCULAR | Status: AC | PRN
  Administered 2014-04-15: 0.5 mg via INTRAVENOUS

## 2014-04-15 MED ORDER — SODIUM CHLORIDE 0.9 % IJ SOLN: 3.0000 mL | INTRAMUSCULAR | Status: DC | PRN

## 2014-04-15 MED ORDER — NALOXONE HCL 0.4 MG/ML IJ SOLN: 0.4000 mg | INTRAMUSCULAR | Status: DC | PRN

## 2014-04-15 MED ORDER — SODIUM CHLORIDE 0.9 % IV SOLN: 250.0000 mL | INTRAVENOUS | Status: DC | PRN

## 2014-04-15 MED ORDER — HYDROMORPHONE 0.3 MG/ML IV SOLN
INTRAVENOUS | Status: AC
  Filled 2014-04-15: qty 25

## 2014-04-15 MED ORDER — CEFAZOLIN SODIUM-DEXTROSE 2-3 GM-% IV SOLR
INTRAVENOUS | Status: AC
  Administered 2014-04-15: 2 g via INTRAVENOUS
  Filled 2014-04-15: qty 50

## 2014-04-15 MED ORDER — DIPHENHYDRAMINE HCL 50 MG/ML IJ SOLN: 12.5000 mg | Freq: Four times a day (QID) | INTRAMUSCULAR | Status: DC | PRN

## 2014-04-15 MED ORDER — KETOROLAC TROMETHAMINE 30 MG/ML IJ SOLN
INTRAMUSCULAR | Status: AC
  Filled 2014-04-15: qty 1

## 2014-04-15 MED ORDER — HYDROMORPHONE HCL 2 MG/ML IJ SOLN
INTRAMUSCULAR | Status: AC
  Filled 2014-04-15: qty 1

## 2014-04-15 MED ORDER — SODIUM CHLORIDE 0.9 % IJ SOLN
3.0000 mL | Freq: Two times a day (BID) | INTRAMUSCULAR | Status: DC
  Administered 2014-04-15: 3 mL via INTRAVENOUS

## 2014-04-15 MED ORDER — DIPHENHYDRAMINE HCL 12.5 MG/5ML PO ELIX
12.5000 mg | ORAL_SOLUTION | Freq: Four times a day (QID) | ORAL | Status: DC | PRN
  Filled 2014-04-15: qty 5

## 2014-04-15 MED ORDER — SODIUM CHLORIDE 0.9 % IV SOLN
INTRAVENOUS | Status: DC
  Administered 2014-04-15: 12:00:00 via INTRAVENOUS

## 2014-04-15 NOTE — Procedures (Signed)
Post UFE.  No immediate complications.  Keep right leg straight for 4 hrs.

## 2014-04-15 NOTE — H&P (Signed)
Chief Complaint: "I'm here for my fibroid procedure"  HPI: Monique Lindsey is an 38 y.o. female with symptomatic uterine fibroids. She was seen in consult for uterine artery embolization. See Dr. Pascal Lux full consult note. She has had MRI in the interim and is now scheduled for [place] procedure. She has been doing well since consult in September. Has been NPO this am PMHx, meds, imaging reviewed.  Past Medical History:  Past Medical History  Diagnosis Date  . Preterm labor   . H/O varicella     Age 48  . Hx: UTI (urinary tract infection)     Frequently  . BV (bacterial vaginosis) 2005  . H/O hemorrhoids 2005  . Monilia infection 2005  . HSV-2 infection 2006  . Candida vaginitis 11/17/10  . Back strain 11/17/10    Past Surgical History:  Past Surgical History  Procedure Laterality Date  . Tubal ligation      Family History:  Family History  Problem Relation Age of Onset  . Hypertension Father     Social History:  reports that she has never smoked. She has never used smokeless tobacco. She reports that she drinks alcohol. She reports that she does not use illicit drugs.  Allergies: No Known Allergies  Medications:   Medication List    ASK your doctor about these medications        ibuprofen 200 MG tablet  Commonly known as:  ADVIL,MOTRIN  Take 600 mg by mouth every 6 (six) hours as needed for mild pain or moderate pain.     tinidazole 500 MG tablet  Commonly known as:  TINDAMAX  4 tablets with food and 8 ounces of water daily x 2 days     valACYclovir 500 MG tablet  Commonly known as:  VALTREX  Take 1 tablet (500 mg total) by mouth daily.        Please HPI for pertinent positives, otherwise complete 10 system ROS negative.  Physical Exam: BP 113/60 mmHg  Pulse 62  Temp(Src) 98.2 F (36.8 C) (Oral)  Resp 18  SpO2 100% There is no weight on file to calculate BMI.   General Appearance:  Alert, cooperative, no distress, appears stated age  Head:   Normocephalic, without obvious abnormality, atraumatic  ENT: Unremarkable  Neck: Supple, symmetrical, trachea midline  Lungs:   Clear to auscultation bilaterally, no w/r/r, respirations unlabored without use of accessory muscles.  Heart:  Regular rate and rhythm, S1, S2 normal, no murmur, rub or gallop.  Abdomen:   Soft, non-tender, non distended.  Extremities: Extremities normal, atraumatic, no cyanosis or edema  Pulses: 2+ and symmetric femoral  Neurologic: Normal affect, no gross deficits.  Labs: Results for orders placed or performed during the hospital encounter of 04/15/14 (from the past 48 hour(s))  CBC with Differential     Status: Abnormal   Collection Time: 04/15/14 12:05 PM  Result Value Ref Range   WBC 6.4 4.0 - 10.5 K/uL   RBC 3.93 3.87 - 5.11 MIL/uL   Hemoglobin 11.2 (L) 12.0 - 15.0 g/dL   HCT 34.1 (L) 36.0 - 46.0 %   MCV 86.8 78.0 - 100.0 fL   MCH 28.5 26.0 - 34.0 pg   MCHC 32.8 30.0 - 36.0 g/dL   RDW 14.7 11.5 - 15.5 %   Platelets 286 150 - 400 K/uL   Neutrophils Relative % 55 43 - 77 %   Neutro Abs 3.6 1.7 - 7.7 K/uL   Lymphocytes Relative 36 12 - 46 %  Lymphs Abs 2.3 0.7 - 4.0 K/uL   Monocytes Relative 7 3 - 12 %   Monocytes Absolute 0.5 0.1 - 1.0 K/uL   Eosinophils Relative 1 0 - 5 %   Eosinophils Absolute 0.1 0.0 - 0.7 K/uL   Basophils Relative 1 0 - 1 %   Basophils Absolute 0.0 0.0 - 0.1 K/uL    Imaging: No results found.  Assessment/Plan Symptomatic uterine fibroids. Discussed [place] procedure, risks, complications, use of sedation.  Plan for overnight admission for observation Labs pending Consent signed in chart  Ascencion Dike PA-C 04/15/2014, 12:33 PM

## 2014-04-15 NOTE — Sedation Documentation (Signed)
5Fr sheath removed from R fem art by Dr. Pascal Lux.  Hemostasis achieved using manual pressure.  Groin level 0, 1+RDP.

## 2014-04-16 ENCOUNTER — Other Ambulatory Visit: Payer: Self-pay | Admitting: Radiology

## 2014-04-16 DIAGNOSIS — D259 Leiomyoma of uterus, unspecified: Secondary | ICD-10-CM | POA: Diagnosis not present

## 2014-04-16 DIAGNOSIS — D251 Intramural leiomyoma of uterus: Secondary | ICD-10-CM

## 2014-04-16 MED ORDER — HYDROCODONE-ACETAMINOPHEN 5-325 MG PO TABS: 1.0000 | ORAL_TABLET | ORAL | Status: DC | PRN

## 2014-04-16 MED ORDER — PROMETHAZINE HCL 12.5 MG PO TABS: 12.5000 mg | ORAL_TABLET | Freq: Four times a day (QID) | ORAL | Status: DC | PRN

## 2014-04-16 MED ORDER — DOCUSATE SODIUM 100 MG PO CAPS: 100.0000 mg | ORAL_CAPSULE | Freq: Two times a day (BID) | ORAL | Status: DC

## 2014-04-16 MED ORDER — IBUPROFEN 200 MG PO TABS: 800.0000 mg | ORAL_TABLET | Freq: Three times a day (TID) | ORAL | Status: AC

## 2014-04-16 NOTE — Discharge Summary (Signed)
Physician Discharge Summary  Patient ID: Monique Lindsey MRN: [id number] DOB/AGE: 1975/12/17 38 y.o.  Admit date: 04/15/2014 Discharge date: 04/16/2014  Admission Diagnoses: Active Problems:   Fibroids   Fibroid  Discharge Diagnoses:  Active Problems:   Fibroids   Fibroid    Procedures: Uterine artery embolization 04/15/2014  Discharged Condition: good  Hospital Course: Brought to IR suite on 12/28, underwent successful bilateral uterine artery embolization. No immediate complications. Admitted to floor in stable condition. No overnight issues, good pain control. POD #1 exam normal. Stable for discharge. Medications, instructions and follow up plan reviewed.   Consults: None   Discharge Exam: Blood pressure 87/44, pulse 68, temperature 99.1 F (37.3 C), temperature source Oral, resp. rate 14, height 5\' 4"  (1.626 m), weight 154 lb (69.854 kg), SpO2 97 %. Lungs: CTA without w/r/r Heart: Regular Abdomen: soft, NT Ext: (R)groin soft, no hematoma   Disposition: Home  Discharge Instructions    Call MD for:  difficulty breathing, headache or visual disturbances    Complete by:  As directed      Call MD for:  persistant nausea and vomiting    Complete by:  As directed      Call MD for:  redness, tenderness, or signs of infection (pain, swelling, redness, odor or green/yellow discharge around incision site)    Complete by:  As directed      Call MD for:  severe uncontrolled pain    Complete by:  As directed      Call MD for:  temperature >100.4    Complete by:  As directed      Diet - low sodium heart healthy    Complete by:  As directed      Increase activity slowly    Complete by:  As directed      May shower / Bathe    Complete by:  As directed      May walk up steps    Complete by:  As directed      No wound care    Complete by:  As directed             Medication List    TAKE these medications        docusate sodium 100 MG capsule  Commonly  known as:  COLACE  Take 1 capsule (100 mg total) by mouth 2 (two) times daily.     HYDROcodone-acetaminophen 5-325 MG per tablet  Commonly known as:  NORCO/VICODIN  Take 1-2 tablets by mouth every 4 (four) hours as needed for moderate pain.     ibuprofen 200 MG tablet  Commonly known as:  ADVIL,MOTRIN  Take 4 tablets (800 mg total) by mouth 3 (three) times daily.     promethazine 12.5 MG tablet  Commonly known as:  PHENERGAN  Take 1 tablet (12.5 mg total) by mouth every 6 (six) hours as needed for nausea or vomiting.     tinidazole 500 MG tablet  Commonly known as:  TINDAMAX  4 tablets with food and 8 ounces of water daily x 2 days     valACYclovir 500 MG tablet  Commonly known as:  VALTREX  Take 1 tablet (500 mg total) by mouth daily.           Follow-up Information    Follow up with Sandi Mariscal, MD. Go in 2 weeks.   Specialty:  Interventional Radiology   Why:  Office will call with appointment date/time. May also call [phone] with questions  Contact information:   [address] [place] [address] [phone]      Greater than 30 minutes face to face time was spent with the patient for the purpose of discharge planning, review of instructions, medications, and follow up plans.  SignedAscencion Dike PA-C 04/16/2014, 12:23 PM

## 2014-04-16 NOTE — Progress Notes (Signed)
UR completed 

## 2014-04-16 NOTE — Discharge Instructions (Signed)
Uterine Artery Embolization for Fibroids, Care After Refer to this sheet in the next few weeks. These instructions provide you with information on caring for yourself after your procedure. Your health care provider may also give you more specific instructions. Your treatment has been planned according to current medical practices, but problems sometimes occur. Call your health care provider if you have any problems or questions after your procedure. WHAT TO EXPECT AFTER THE PROCEDURE After your procedure, it is typical to have cramping in the pelvis. You will be given pain medicine to control it. HOME CARE INSTRUCTIONS  Only take over-the-counter or prescription medicines for pain, discomfort, or fever as directed by your health care provider.  Do not take aspirin. It can cause bleeding.  Follow your health care provider's advice regarding medicines given to you, diet, activity, and when to begin sexual activity.  See your health care provider for follow-up care as directed. SEEK MEDICAL CARE IF:  You have a fever.  You have redness, swelling, and pain around your incision site.  You have pus draining from your incision.  You have a rash. SEEK IMMEDIATE MEDICAL CARE IF:  You have bleeding from your incision site.  You have difficulty breathing.  You have chest pain.  You have severe abdominal pain.  You have leg pain.  You become dizzy and faint. Document Released: 01/24/2013 Document Reviewed: 01/24/2013 [facility] Patient Information 2015 [place], [place]. This information is not intended to replace advice given to you by your health care provider. Make sure you discuss any questions you have with your health care provider. Uterine Artery Embolization for Fibroids Uterine artery embolization is a nonsurgical treatment to shrink fibroids. A thin plastic tube (catheter) is used to inject material that blocks off the blood supply to the fibroid, which causes the fibroid to  shrink. LET [facility] CARE PROVIDER KNOW ABOUT:  Any allergies you have.  All medicines you are taking, including vitamins, herbs, eye drops, creams, and over-the-counter medicines.  Previous problems you or members of your family have had with the use of anesthetics.  Any blood disorders you have.  Previous surgeries you have had.  Medical conditions you have. RISKS AND COMPLICATIONS  Injury to the uterus from decreased blood supply  Infection.  Blood infection (septicemia).  Lack of menstrual periods (amenorrhea).  Death of tissue cells (necrosis) around your bladder or vulva.  Development of a hole between organs or from an organ to the surface of your skin (fistula).  Blood clot in the legs (deep vein thrombosis) or lung (pulmonary embolus). BEFORE THE PROCEDURE  Ask your health care provider about changing or stopping your regular medicines.   Do not take aspirin or blood thinners (anticoagulants) for 1 week before the surgery or as directed by your health care provider.  Do not eat or drink anything for 8 hours before the surgery or as directed by your health care provider.   Empty your bladder before the procedure begins. PROCEDURE   An IV tube will be placed into one of your veins. This will be used to give you a sedative and pain medication (conscious sedation).  You will be given a medicine that numbs the area (local anesthetic).  A small cut will be made in your groin. A catheter is then inserted into the main artery of your leg.  The catheter will be guided through the artery to your uterus. A series of images will be taken while dye is injected through the catheter in your  groin. X-rays are taken at the same time. This is done to provide a road map of the blood supply to your uterus and fibroids.  Tiny plastic spheres, about the size of sand grains, will be injected through the catheter. Metal coils may be used to help block the artery. The particles  will lodge in tiny branches of the uterine artery that supplies blood to the fibroids.  The procedure is repeated on the artery that supplies the other side of the uterus.  The catheter is then removed and pressure is held to stop any bleeding. No stitches are needed.  A dressing is then placed over the cut (incision). AFTER THE PROCEDURE  You will be taken to a recovery area where your progress will be monitored until you are awake, stable, and taking fluids well. If there are no other problems, you will then be moved to a regular hospital room.  You will be observed overnight in the hospital.  You will have cramping that should be controlled with pain medication. Document Released: 06/21/2005 Document Revised: 01/24/2013 Document Reviewed: 10/19/2012 [facility] Patient Information 2015 [place], [place]. This information is not intended to replace advice given to you by your health care provider. Make sure you discuss any questions you have with your health care provider.

## 2014-04-16 NOTE — Progress Notes (Signed)
Doing well this am. Some nausea and mild flank/pelvis pain BP 99/54 mmHg  Pulse 68  Temp(Src) 97.7 F (36.5 C) (Oral)  Resp 14  Ht 5\' 4"  (1.626 m)  Wt 154 lb (69.854 kg)  BMI 26.42 kg/m2  SpO2 100% Abd: soft, mildly tender (R)groin site NT, no hematoma  DC PCA Increase activity  DC home later this am.  Ascencion Dike PA-C Interventional Radiology 04/16/2014 9:12 AM

## 2014-04-25 ENCOUNTER — Encounter: Payer: Self-pay | Admitting: Radiology

## 2014-04-25 ENCOUNTER — Ambulatory Visit
Admission: RE | Admit: 2014-04-25 | Discharge: 2014-04-25 | Disposition: A | Discharge status: Home / Self Care | Payer: BLUE CROSS/BLUE SHIELD | Source: Ambulatory Visit | Attending: Radiology | Admitting: Radiology

## 2014-04-25 DIAGNOSIS — D251 Intramural leiomyoma of uterus: Secondary | ICD-10-CM

## 2014-04-25 HISTORY — PX: IR GENERIC HISTORICAL: IMG1180011

## 2014-04-25 HISTORY — DX: Benign neoplasm of connective and other soft tissue, unspecified: D21.9

## 2014-04-25 NOTE — Progress Notes (Signed)
Patient ID: Monique Lindsey, female   DOB: 1975-06-21, 39 y.o.   MRN: [id number]   Chief Complaint: Chief Complaint  Patient presents with  . Follow-up    10 follow up [place]      Referring Physician(s): Garwin Brothers, Sheronetta A  History of Present Illness: Monique Lindsey is a 39 y.o. female (G2, P2) who was initially diagnosed with uterine fibroids during pelvic ultrasound performed at [facility] on 10/17/2013 for the workup of menorrhagia. Patient was initially seen in consultation at the interventional radiology clinic on 03/12/2014 at which time she admitted to worsening menorrhagia, the middle 2 days of which are associate with heavy bleeding, necessitating the changing of pads every 30 minutes and occasionally having to wear 2 pads the same time. She also admitted to bulk-like symptoms including bloating, right sided lower abdominal pain, urinary frequency and occasional dysparenuria. At that time, the patient was not interested in undergoing a surgical procedure and as such underwent successful bilateral uterine artery embolization on 04/15/2014 and returns today for postprocedural consultation. She is unaccompanied and serves as her own historian.  The patient has recovered fully from the procedure and desires to go back to work.  She states she may have had a cycle approximately 3 days following the procedure though states this was associated with markedly last menorrhagia. She admits to a very minimal intermittent spotting. Patient states she experiences transient right-sided lower abdominal/pelvic pain though this has been well-tolerated and has not required the use of any prescribed narcotics. Patient denies dysuria or hematuria. No change in bowel function.  Past Medical History  Diagnosis Date  . Preterm labor   . H/O varicella     Age 48  . Hx: UTI (urinary tract infection)     Frequently  . BV (bacterial vaginosis) 2005  . H/O hemorrhoids 2005  . Monilia infection 2005  .  HSV-2 infection 2006  . Candida vaginitis 11/17/10  . Back strain 11/17/10  . Fibroids     Past Surgical History  Procedure Laterality Date  . Tubal ligation    . Uterine artery embolization  04/15/2014    Allergies: Review of patient's allergies indicates no known allergies.  Medications: Prior to Admission medications   Medication Sig Start Date End Date Taking? Authorizing Provider  ibuprofen (ADVIL,MOTRIN) 200 MG tablet Take 4 tablets (800 mg total) by mouth 3 (three) times daily. 04/16/14  Yes Ascencion Dike, PA-C  docusate sodium (COLACE) 100 MG capsule Take 1 capsule (100 mg total) by mouth 2 (two) times daily. Patient not taking: Reported on 04/25/2014 04/16/14   Ascencion Dike, PA-C  HYDROcodone-acetaminophen (NORCO/VICODIN) 5-325 MG per tablet Take 1-2 tablets by mouth every 4 (four) hours as needed for moderate pain. Patient not taking: Reported on 04/25/2014 04/16/14   Ascencion Dike, PA-C  promethazine (PHENERGAN) 12.5 MG tablet Take 1 tablet (12.5 mg total) by mouth every 6 (six) hours as needed for nausea or vomiting. Patient not taking: Reported on 04/25/2014 04/16/14   Ascencion Dike, PA-C  tinidazole (TINDAMAX) 500 MG tablet 4 tablets with food and 8 ounces of water daily x 2 days Patient not taking: Reported on 04/08/2014 11/09/11   Earnstine Regal, PA-C  valACYclovir (VALTREX) 500 MG tablet Take 1 tablet (500 mg total) by mouth daily. Patient not taking: Reported on 04/08/2014 06/07/12   Ena Dawley, MD    Family History  Problem Relation Age of Onset  . Hypertension Father     History   Social History  .  Marital Status: Divorced    Spouse Name: N/A    Number of Children: N/A  . Years of Education: N/A   Social History Main Topics  . Smoking status: Never Smoker   . Smokeless tobacco: Never Used  . Alcohol Use: Yes     Comment: OCCASIONAL wine    . Drug Use: No  . Sexual Activity: Yes    Birth Control/ Protection: Surgical     Comment: BTL   Other Topics  Concern  . Not on file   Social History Narrative    ECOG Status: 0 - Asymptomatic  Review of Systems: A 12 point ROS discussed and pertinent positives are indicated in the HPI above.  All other systems are negative.  Review of Systems  Constitutional: Negative.   Respiratory: Negative.   Cardiovascular: Negative.   Gastrointestinal: Negative.   Genitourinary: Negative for dysuria, frequency, hematuria, flank pain and difficulty urinating.       Pt admits to intermittent spotting.    Vital Signs: BP 112/76 mmHg  Pulse 72  Temp(Src) 98.2 F (36.8 C) (Oral)  Resp 15  SpO2 100%  Physical Exam  Constitutional: She appears well-developed and well-nourished.  Cardiovascular: Normal rate, regular rhythm and intact distal pulses.   Pulmonary/Chest: Effort normal.  Abdominal: Soft.  Genitourinary:  I am unable to palpate the uterine fundus.   Skin: Skin is warm and dry.  Well healed right groin access site.  Psychiatric: She has a normal mood and affect.    Imaging:  Selective images from preprocedural contrast enhanced pelvic MRI performed 03/12/2014 as well as angiographic images from bilateral uterine artery embolization performed 04/15/2014 reviewed with the patient.  Labs:  CBC:  Recent Labs  04/15/14 1205  WBC 6.4  HGB 11.2*  HCT 34.1*  PLT 286    COAGS:  Recent Labs  04/15/14 1205  INR 1.04    BMP:  Recent Labs  04/15/14 1205  NA 136  K 3.6  CL 108  CO2 20  GLUCOSE 77  BUN 10  CALCIUM 8.7  CREATININE 0.84  GFRNONAA 86*  GFRAA >90    LIVER FUNCTION TESTS: No results for input(s): BILITOT, AST, ALT, ALKPHOS, PROT, ALBUMIN in the last 8760 hours.  TUMOR MARKERS: No results for input(s): AFPTM, CEA, CA199, CHROMGRNA in the last 8760 hours.  Assessment and Plan:  Monique Lindsey is a 39 y.o. female (G2, P2) with past medical history significant for symptomatic uterine fibroids including bulk-like symptoms as well as severe  menorrhagia who underwent a technically successful bilateral uterine artery embolization on 04/15/2014 and returns today for postprocedural consultation.  The patient has recovered fully from the procedure and desires to go back to work for which she may without limitation.   Selective images from preprocedural contrast enhanced pelvic MRI performed 03/12/2014 as well as angiographic images from bilateral uterine artery embolization performed 04/15/2014 reviewed with the patient.  The patient states she may have all ready experienced a cycle occurring approximately 2-3 days following the procedure, for which she feels she has already noticed a marked improvement in her menorrhagia. I explained to the patient that her subsequent cycles may be somewhat variable, potentially with persistent menorrhagia or potentially intermittent spotting, though that in the coming 3-6 months, I would expect a marked improvement in her menorrhagia as well as a slow improvement in her bulk symptoms.  The patient will be contacted by phone in approximately 3 months to ensure continued subjective improvement.  She will be  seen in follow-up consultation in approximately 6 months (June or July 2016).  Follow-up imaging will only be obtained if the patient is experiencing persistent symptoms.  Ronny Bacon, MD   I spent a total of 25 minutes face to face in clinical consultation, greater than 50% of which was counseling/coordinating care for symptomatically uterine fibroids.  SignedSandi Mariscal 04/25/2014, 5:13 PM

## 2014-04-25 NOTE — Progress Notes (Signed)
10 days post [place].    Minimal spotting.  Minimal cramping.  Has resumed normal activities.    Plans to return to work on 04/29/2014.  Doing great!  Alonni Heimsoth Riki Rusk, RN 04/25/2014 3:40 PM Reece Levy, RN 04/25/2014 3:40 PM

## 2014-07-09 ENCOUNTER — Telehealth: Payer: Self-pay | Admitting: Radiology

## 2014-07-09 NOTE — Telephone Encounter (Signed)
3 mo s/p [place] call.  Left message requesting patient to call for status update.  Dayla Gasca [place], [place] 07/09/2014 11:22 AM

## 2014-10-04 ENCOUNTER — Other Ambulatory Visit (HOSPITAL_COMMUNITY): Payer: Self-pay | Admitting: Interventional Radiology

## 2014-10-04 DIAGNOSIS — D251 Intramural leiomyoma of uterus: Secondary | ICD-10-CM

## 2014-10-08 ENCOUNTER — Other Ambulatory Visit: Payer: BLUE CROSS/BLUE SHIELD

## 2015-01-01 ENCOUNTER — Other Ambulatory Visit: Payer: Self-pay

## 2015-01-01 DIAGNOSIS — Z1231 Encounter for screening mammogram for malignant neoplasm of breast: Secondary | ICD-10-CM

## 2015-01-13 ENCOUNTER — Ambulatory Visit
Admission: RE | Admit: 2015-01-13 | Discharge: 2015-01-13 | Disposition: A | Discharge status: Home / Self Care | Payer: BLUE CROSS/BLUE SHIELD | Source: Ambulatory Visit

## 2015-01-13 DIAGNOSIS — Z1231 Encounter for screening mammogram for malignant neoplasm of breast: Secondary | ICD-10-CM

## 2016-02-05 ENCOUNTER — Other Ambulatory Visit: Payer: Self-pay | Admitting: Family Medicine

## 2016-02-05 DIAGNOSIS — Z1231 Encounter for screening mammogram for malignant neoplasm of breast: Secondary | ICD-10-CM

## 2016-02-17 ENCOUNTER — Ambulatory Visit
Admission: RE | Admit: 2016-02-17 | Discharge: 2016-02-17 | Disposition: A | Discharge status: Home / Self Care | Payer: BLUE CROSS/BLUE SHIELD | Source: Ambulatory Visit | Attending: Family Medicine | Admitting: Family Medicine

## 2016-02-17 DIAGNOSIS — Z1231 Encounter for screening mammogram for malignant neoplasm of breast: Secondary | ICD-10-CM

## 2016-04-21 ENCOUNTER — Encounter: Payer: Self-pay | Admitting: Interventional Radiology

## 2016-10-26 ENCOUNTER — Ambulatory Visit (HOSPITAL_COMMUNITY)
Admission: RE | Admit: 2016-10-26 | Discharge: 2016-10-26 | Disposition: A | Discharge status: Home / Self Care | Payer: BLUE CROSS/BLUE SHIELD | Source: Ambulatory Visit | Attending: Vascular Surgery | Admitting: Vascular Surgery

## 2016-10-26 ENCOUNTER — Other Ambulatory Visit (HOSPITAL_COMMUNITY): Payer: Self-pay | Admitting: Internal Medicine

## 2016-10-26 DIAGNOSIS — M7989 Other specified soft tissue disorders: Secondary | ICD-10-CM | POA: Diagnosis not present

## 2016-10-26 DIAGNOSIS — R2243 Localized swelling, mass and lump, lower limb, bilateral: Secondary | ICD-10-CM | POA: Insufficient documentation

## 2016-10-26 DIAGNOSIS — M79669 Pain in unspecified lower leg: Secondary | ICD-10-CM | POA: Diagnosis not present

## 2016-11-29 ENCOUNTER — Ambulatory Visit: Payer: Self-pay | Admitting: Podiatry

## 2016-12-23 ENCOUNTER — Ambulatory Visit: Payer: Self-pay | Admitting: Podiatry

## 2017-02-15 ENCOUNTER — Ambulatory Visit: Payer: Self-pay | Admitting: Podiatry

## 2017-03-24 ENCOUNTER — Ambulatory Visit (INDEPENDENT_AMBULATORY_CARE_PROVIDER_SITE_OTHER): Payer: BLUE CROSS/BLUE SHIELD | Admitting: Podiatry

## 2017-03-24 ENCOUNTER — Encounter: Payer: Self-pay | Admitting: Podiatry

## 2017-03-24 ENCOUNTER — Ambulatory Visit (INDEPENDENT_AMBULATORY_CARE_PROVIDER_SITE_OTHER): Payer: BLUE CROSS/BLUE SHIELD

## 2017-03-24 ENCOUNTER — Ambulatory Visit: Payer: BLUE CROSS/BLUE SHIELD

## 2017-03-24 DIAGNOSIS — M659 Synovitis and tenosynovitis, unspecified: Secondary | ICD-10-CM | POA: Diagnosis not present

## 2017-03-24 DIAGNOSIS — M722 Plantar fascial fibromatosis: Secondary | ICD-10-CM

## 2017-03-24 DIAGNOSIS — M216X2 Other acquired deformities of left foot: Secondary | ICD-10-CM | POA: Diagnosis not present

## 2017-03-24 DIAGNOSIS — M216X1 Other acquired deformities of right foot: Secondary | ICD-10-CM

## 2017-03-24 MED ORDER — MELOXICAM 15 MG PO TABS: 15.0000 mg | ORAL_TABLET | Freq: Every day | ORAL | 0 refills | Status: AC

## 2017-03-24 NOTE — Patient Instructions (Signed)

## 2017-03-24 NOTE — Progress Notes (Signed)
Subjective:  Patient ID: Monique Lindsey, female    DOB: March 16, 1976,  MRN: [id number]  Chief Complaint  Patient presents with  . Foot Problem    i am having some pain on both feet   I have some pain on both of my feet and it has been going on and burns and throbs and is sore and tender and gets numb and tingles some and feels like they are swelling and I am not a diabetic and my feet are cold   41 y.o. female presents with the above complaint.  Reports pain to both heels times 3 months.  Denies prior treatments  Past Medical History:  Diagnosis Date  . Back strain 11/17/10  . BV (bacterial vaginosis) 2005  . Candida vaginitis 11/17/10  . Fibroids   . H/O hemorrhoids 2005  . H/O varicella    Age 35  . HSV-2 infection 2006  . Hx: UTI (urinary tract infection)    Frequently  . Monilia infection 2005  . Preterm labor    Past Surgical History:  Procedure Laterality Date  . IR GENERIC HISTORICAL  04/25/2014   IR RADIOLOGIST EVAL & MGMT 04/25/2014 Sandi Mariscal, MD [facility] INTERV RAD  . TUBAL LIGATION    . UTERINE ARTERY EMBOLIZATION  04/15/2014    Current Outpatient Medications:  .  docusate sodium (COLACE) 100 MG capsule, Take 1 capsule (100 mg total) by mouth 2 (two) times daily. (Patient not taking: Reported on 04/25/2014), Disp: 20 capsule, Rfl: 0 .  HYDROcodone-acetaminophen (NORCO/VICODIN) 5-325 MG per tablet, Take 1-2 tablets by mouth every 4 (four) hours as needed for moderate pain. (Patient not taking: Reported on 04/25/2014), Disp: 30 tablet, Rfl: 0 .  ibuprofen (ADVIL,MOTRIN) 200 MG tablet, Take 4 tablets (800 mg total) by mouth 3 (three) times daily., Disp: 21 tablet, Rfl: 0 .  meloxicam (MOBIC) 15 MG tablet, Take 1 tablet (15 mg total) by mouth daily., Disp: 30 tablet, Rfl: 0 .  meloxicam (MOBIC) 15 MG tablet, Take 1 tablet (15 mg total) by mouth daily., Disp: 30 tablet, Rfl: 0 .  promethazine (PHENERGAN) 12.5 MG tablet, Take 1 tablet (12.5 mg total) by mouth every 6 (six) hours  as needed for nausea or vomiting. (Patient not taking: Reported on 04/25/2014), Disp: 20 tablet, Rfl: 0 .  tinidazole (TINDAMAX) 500 MG tablet, 4 tablets with food and 8 ounces of water daily x 2 days (Patient not taking: Reported on 04/08/2014), Disp: 8 tablet, Rfl: 0 .  valACYclovir (VALTREX) 500 MG tablet, Take 1 tablet (500 mg total) by mouth daily. (Patient not taking: Reported on 04/08/2014), Disp: 30 tablet, Rfl: 1  No Known Allergies Review of Systems Objective:  There were no vitals filed for this visit. General AA&O x3. Normal mood and affect.  Vascular Dorsalis pedis and posterior tibial pulses  present 2+ bilaterally  Capillary refill normal to all digits. Pedal hair growth normal.  Neurologic Epicritic sensation grossly present bilaterally.  Dermatologic No open lesions. Interspaces clear of maceration. Nails well groomed and normal in appearance.  Orthopedic: MMT 5/5 in dorsiflexion, plantarflexion, inversion, and eversion bilaterally. Tender to palpation at the calcaneal tuber bilaterally. No pain with calcaneal squeeze bilaterally. Ankle ROM diminished range of motion bilaterally. Silfverskiold Test: positive bilaterally.   Radiographs: Taken and reviewed. No acute fractures. No evidence of calcaneal stress fracture.  Assessment & Plan:  Patient was evaluated and treated and all questions answered.  Plantar Fasciitis, bilaterally - XR reviewed as above.  - Educated on  icing and stretching. Instructions given.  - Injection delivered to the plantar fascia as below. - Night splint dispensed.  Procedure: Injection Tendon/Ligament Location: Bilateral plantar fascia at the glabrous junction; medial approach. Skin Prep: Alcohol. Injectate: 1 cc 0.5% marcaine plain, 1 cc dexamethasone phosphate, 0.5 cc kenalog 10. Disposition: Patient tolerated procedure well. Injection site dressed with a band-aid.  Return in about 3 weeks (around 04/14/2017).

## 2017-04-20 ENCOUNTER — Ambulatory Visit: Payer: BLUE CROSS/BLUE SHIELD | Admitting: Podiatry

## 2017-05-28 ENCOUNTER — Other Ambulatory Visit: Payer: Self-pay | Admitting: Podiatry

## 2017-06-09 ENCOUNTER — Other Ambulatory Visit: Payer: Self-pay | Admitting: Endocrinology

## 2017-06-09 ENCOUNTER — Other Ambulatory Visit: Payer: Self-pay | Admitting: Family Medicine

## 2017-06-09 DIAGNOSIS — Z1231 Encounter for screening mammogram for malignant neoplasm of breast: Secondary | ICD-10-CM

## 2017-06-29 ENCOUNTER — Ambulatory Visit
Admission: RE | Admit: 2017-06-29 | Discharge: 2017-06-29 | Disposition: A | Discharge status: Home / Self Care | Payer: BLUE CROSS/BLUE SHIELD | Source: Ambulatory Visit | Attending: Endocrinology | Admitting: Endocrinology

## 2017-06-29 DIAGNOSIS — Z1231 Encounter for screening mammogram for malignant neoplasm of breast: Secondary | ICD-10-CM

## 2018-04-29 ENCOUNTER — Other Ambulatory Visit: Payer: Self-pay | Admitting: Podiatry

## 2018-10-12 ENCOUNTER — Other Ambulatory Visit: Payer: Self-pay | Admitting: Internal Medicine

## 2018-10-12 DIAGNOSIS — Z1231 Encounter for screening mammogram for malignant neoplasm of breast: Secondary | ICD-10-CM

## 2018-11-24 ENCOUNTER — Ambulatory Visit: Payer: BLUE CROSS/BLUE SHIELD

## 2018-12-19 ENCOUNTER — Ambulatory Visit
Admission: RE | Admit: 2018-12-19 | Discharge: 2018-12-19 | Disposition: A | Discharge status: Home / Self Care | Payer: BC Managed Care – PPO | Source: Ambulatory Visit | Attending: Internal Medicine | Admitting: Internal Medicine

## 2018-12-19 ENCOUNTER — Other Ambulatory Visit: Payer: Self-pay

## 2018-12-19 DIAGNOSIS — Z1231 Encounter for screening mammogram for malignant neoplasm of breast: Secondary | ICD-10-CM

## 2020-01-24 ENCOUNTER — Other Ambulatory Visit: Payer: Self-pay | Admitting: Internal Medicine

## 2020-01-24 DIAGNOSIS — Z1231 Encounter for screening mammogram for malignant neoplasm of breast: Secondary | ICD-10-CM

## 2020-02-07 ENCOUNTER — Ambulatory Visit: Payer: BLUE CROSS/BLUE SHIELD

## 2020-02-18 ENCOUNTER — Ambulatory Visit
Admission: RE | Admit: 2020-02-18 | Discharge: 2020-02-18 | Disposition: A | Discharge status: Home / Self Care | Payer: BC Managed Care – PPO | Source: Ambulatory Visit | Attending: Internal Medicine | Admitting: Internal Medicine

## 2020-02-18 ENCOUNTER — Other Ambulatory Visit: Payer: Self-pay

## 2020-02-18 DIAGNOSIS — Z1231 Encounter for screening mammogram for malignant neoplasm of breast: Secondary | ICD-10-CM

## 2020-02-25 ENCOUNTER — Other Ambulatory Visit: Payer: Self-pay | Admitting: Internal Medicine

## 2020-02-25 DIAGNOSIS — R928 Other abnormal and inconclusive findings on diagnostic imaging of breast: Secondary | ICD-10-CM

## 2020-03-10 ENCOUNTER — Other Ambulatory Visit: Payer: Self-pay

## 2020-03-10 ENCOUNTER — Ambulatory Visit
Admission: RE | Admit: 2020-03-10 | Discharge: 2020-03-10 | Disposition: A | Discharge status: Home / Self Care | Payer: BC Managed Care – PPO | Source: Ambulatory Visit | Attending: Internal Medicine | Admitting: Internal Medicine

## 2020-03-10 DIAGNOSIS — R928 Other abnormal and inconclusive findings on diagnostic imaging of breast: Secondary | ICD-10-CM

## 2022-05-24 IMAGING — MG MM DIGITAL DIAGNOSTIC UNILAT*L* W/ TOMO W/ CAD
4 series · 4 of 12 positions shown · non-contrast
Comparison: Previous exam(s).

CLINICAL DATA: Patient recalled from screening for left breast
asymmetry.

EXAM:
DIGITAL DIAGNOSTIC LEFT MAMMOGRAM WITH CAD AND TOMO
ULTRASOUND LEFT BREAST

[L MLO synth-2D]
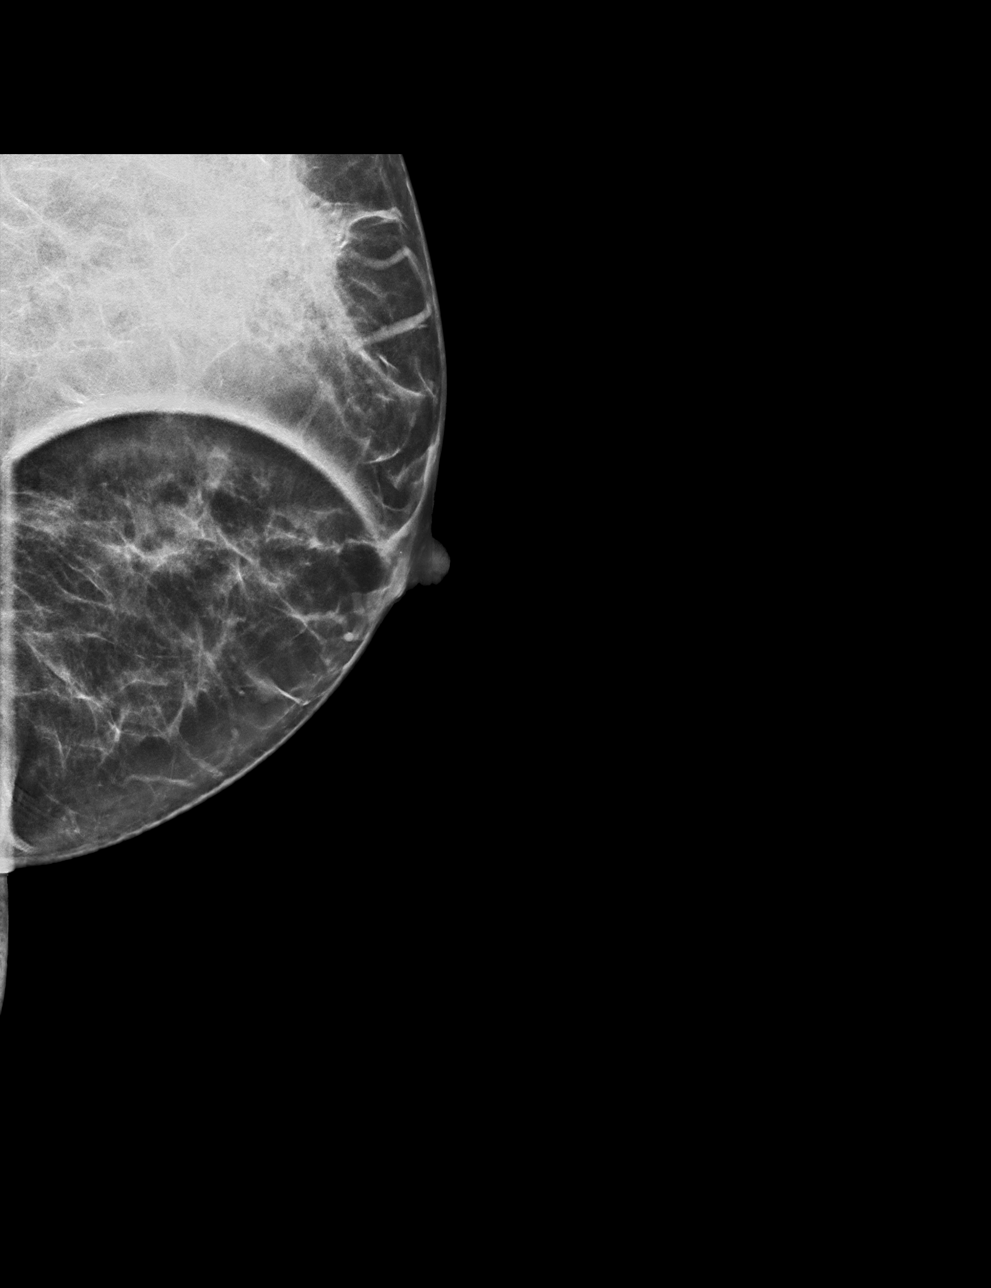

[L CC synth-2D]
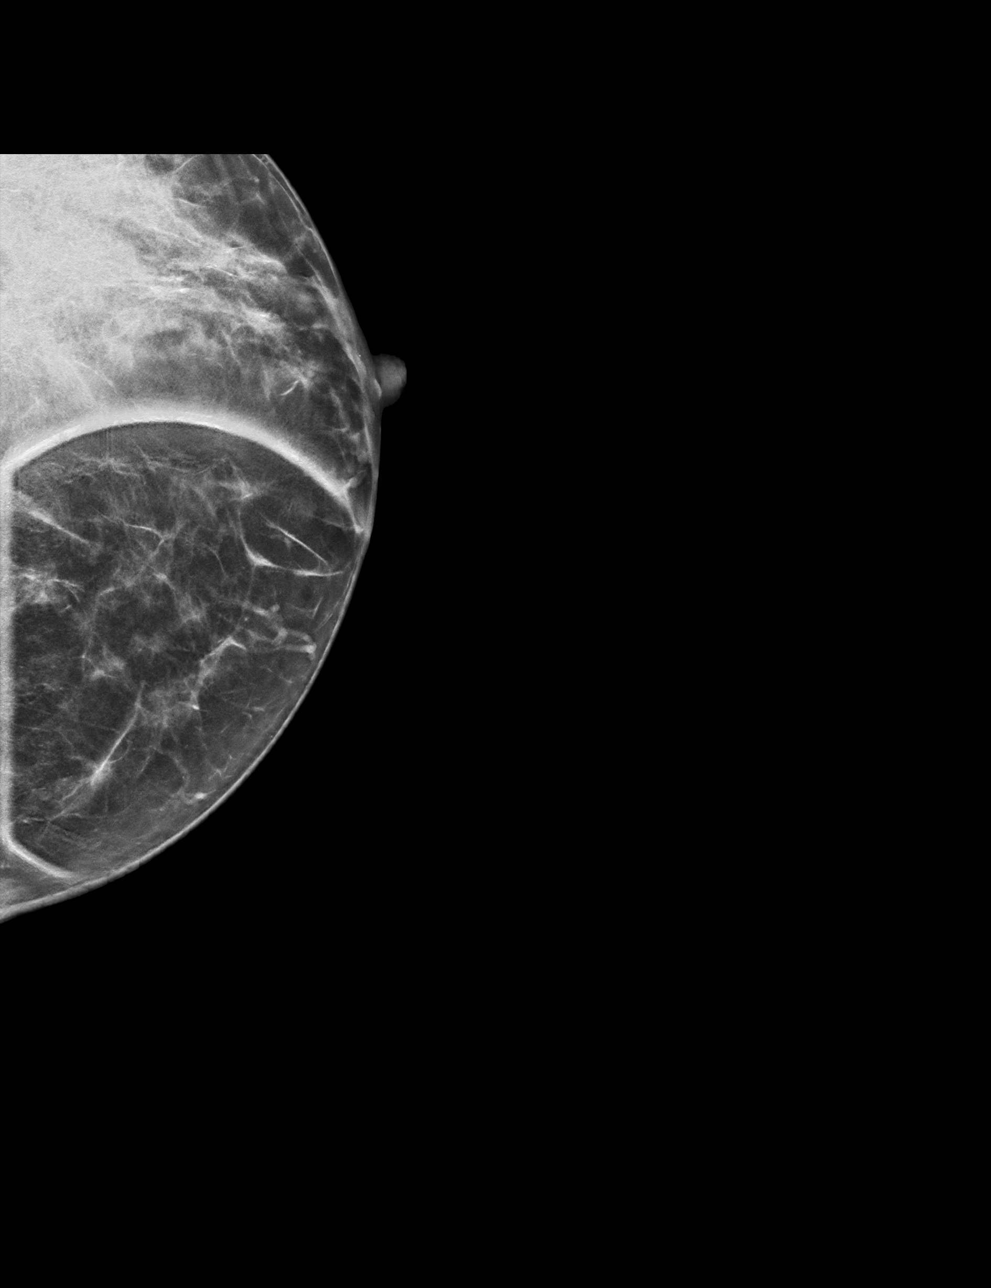

[L MLO tomo · tomo slice 29/58.0]
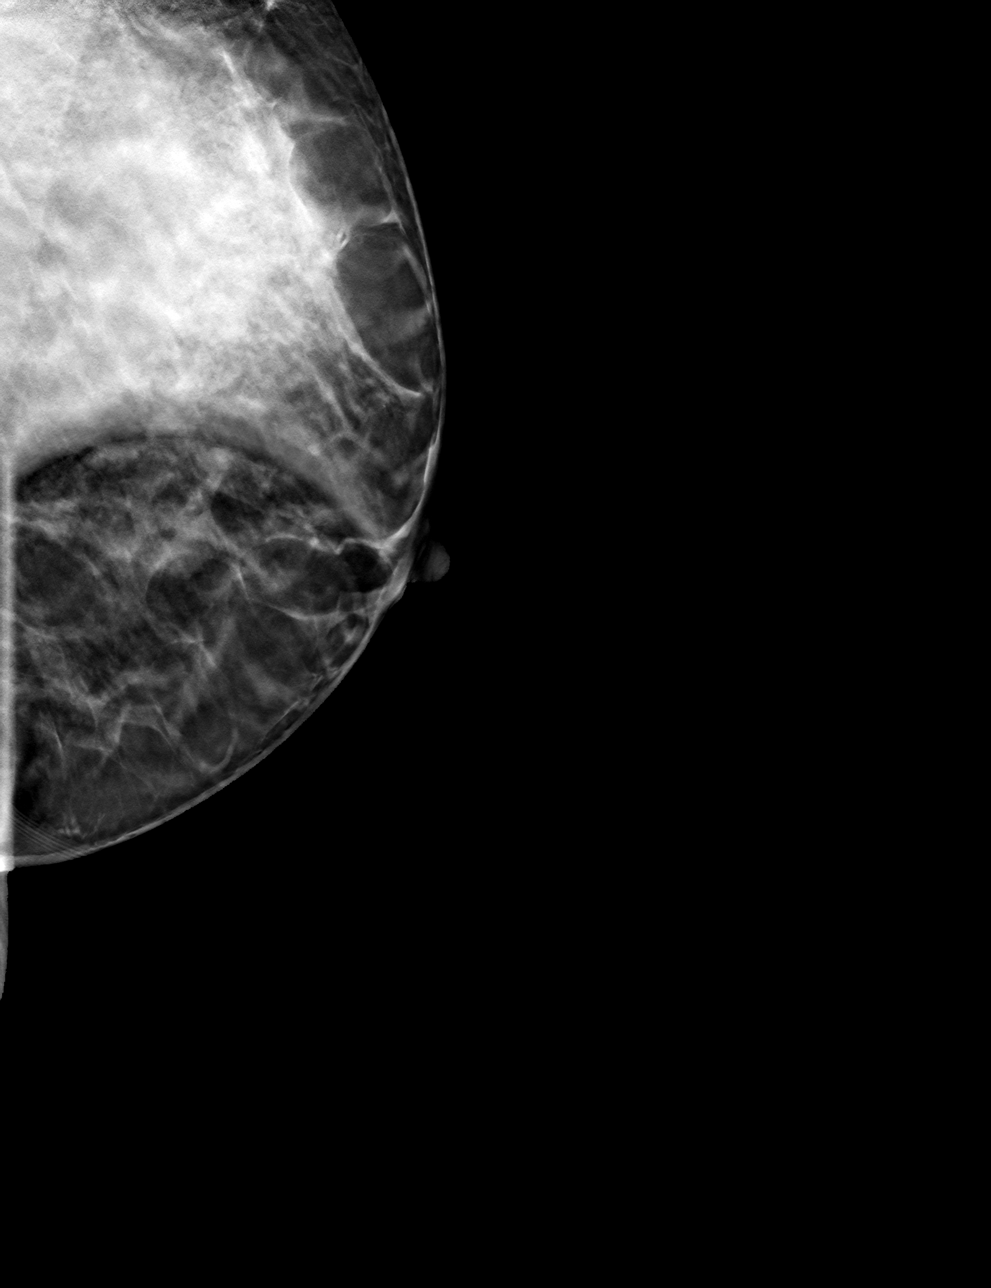

[L CC tomo · tomo slice 26/51.0]
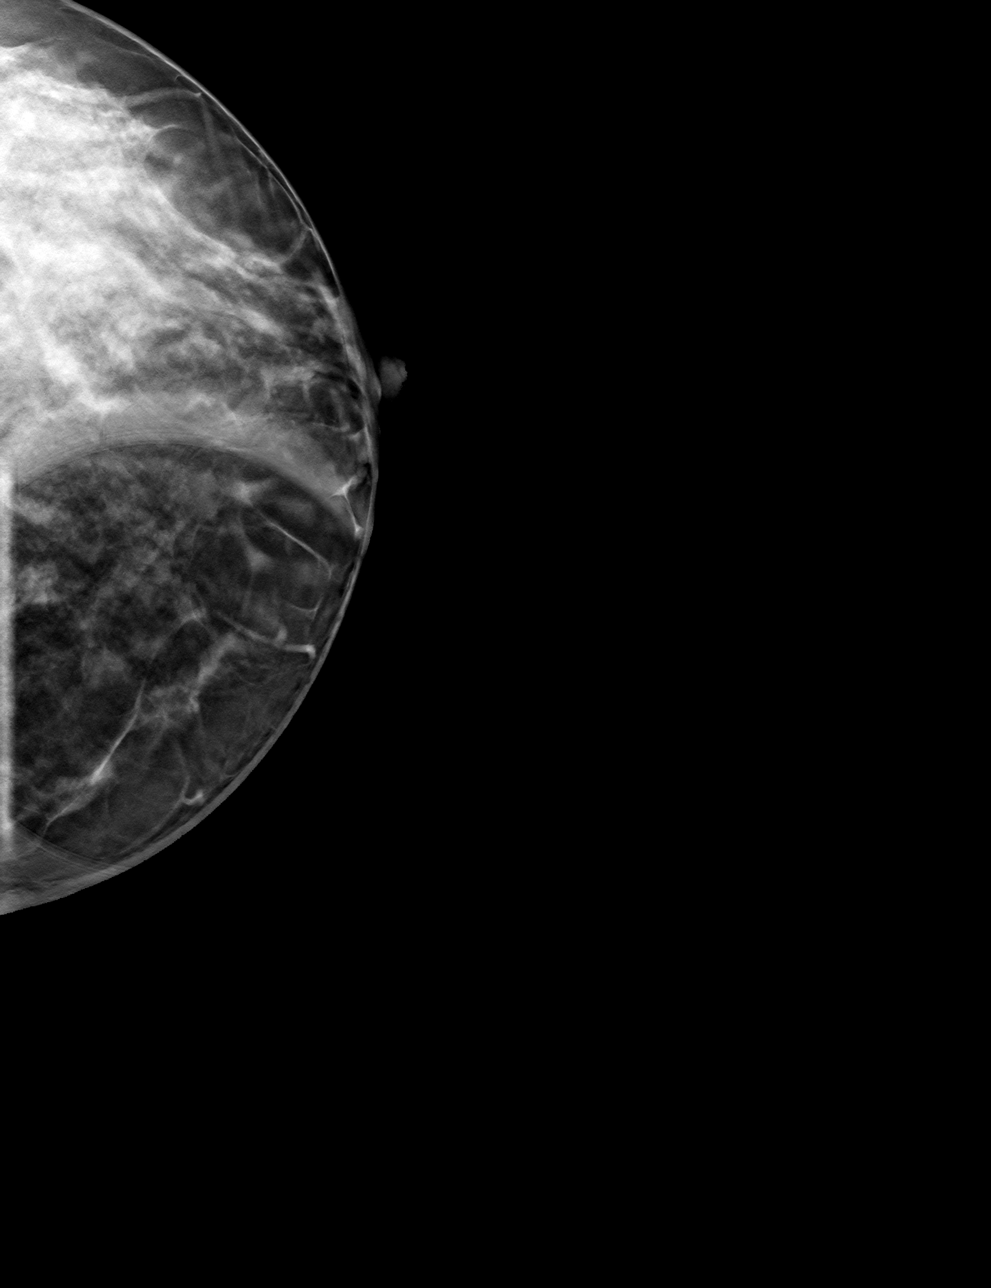

[4 of 12 positions shown; findings below may reference images not displayed]

ACR Breast Density Category b: There are scattered areas of
fibroglandular density.
FINDINGS: There is a persistent low-density circumscribed mass within the
lower inner left breast anterior depth, demonstrated best on the
spot MLO tomosynthesis images.

Mammographic images were processed with CAD.

Targeted ultrasound is performed, showing a 5 x 2 x 3 mm mildly
complicated cyst left breast 7 o'clock position 1 cm from the
nipple.
IMPRESSION: Left breast cyst.

No mammographic evidence for malignancy.

RECOMMENDATION:
Screening mammogram in one year.(Code:6H-[id number])

I have discussed the findings and recommendations with the patient.
If applicable, a reminder letter will be sent to the patient
regarding the next appointment.

BI-RADS CATEGORY  2: Benign.

## 2022-09-12 ENCOUNTER — Emergency Department (HOSPITAL_BASED_OUTPATIENT_CLINIC_OR_DEPARTMENT_OTHER)
Admission: EM | Admit: 2022-09-12 | Discharge: 2022-09-12 | Disposition: A | Discharge status: Home / Self Care | Payer: BC Managed Care – PPO | Attending: Emergency Medicine | Admitting: Emergency Medicine

## 2022-09-12 ENCOUNTER — Other Ambulatory Visit: Payer: Self-pay

## 2022-09-12 ENCOUNTER — Encounter (HOSPITAL_BASED_OUTPATIENT_CLINIC_OR_DEPARTMENT_OTHER): Payer: Self-pay | Admitting: Emergency Medicine

## 2022-09-12 DIAGNOSIS — J019 Acute sinusitis, unspecified: Secondary | ICD-10-CM | POA: Diagnosis not present

## 2022-09-12 DIAGNOSIS — Z1152 Encounter for screening for COVID-19: Secondary | ICD-10-CM | POA: Insufficient documentation

## 2022-09-12 DIAGNOSIS — R040 Epistaxis: Secondary | ICD-10-CM | POA: Insufficient documentation

## 2022-09-12 DIAGNOSIS — J040 Acute laryngitis: Secondary | ICD-10-CM | POA: Diagnosis not present

## 2022-09-12 DIAGNOSIS — R0981 Nasal congestion: Secondary | ICD-10-CM | POA: Diagnosis present

## 2022-09-12 LAB — URINALYSIS, ROUTINE W REFLEX MICROSCOPIC
Bacteria, UA: NONE SEEN
Bilirubin Urine: NEGATIVE
Glucose, UA: NEGATIVE mg/dL
Ketones, ur: NEGATIVE mg/dL
Nitrite: NEGATIVE
Protein, ur: NEGATIVE mg/dL
Specific Gravity, Urine: 1.013 (ref 1.005–1.030)
pH: 6 (ref 5.0–8.0)

## 2022-09-12 LAB — SARS CORONAVIRUS 2 BY RT PCR: SARS Coronavirus 2 by RT PCR: NEGATIVE

## 2022-09-12 MED ORDER — AMOXICILLIN 500 MG PO CAPS: 500.0000 mg | ORAL_CAPSULE | Freq: Three times a day (TID) | ORAL | 0 refills | Status: AC

## 2022-09-12 MED ORDER — FLUTICASONE PROPIONATE 50 MCG/ACT NA SUSP: 2.0000 | Freq: Every day | NASAL | 0 refills | Status: AC

## 2022-09-12 NOTE — Discharge Instructions (Addendum)
Please read and follow all provided instructions.  Your diagnoses today include:  1. Acute non-recurrent sinusitis, unspecified location   2. Laryngitis     You appear to have an upper respiratory infection (URI). It should improve gradually after 5-7 days. You may have a lingering cough that lasts for 2- 4 weeks after the infection.  Tests performed today include: Vital signs. See below for your results today.   Medications prescribed:  Amoxicillin - antibiotic  You have been prescribed an antibiotic medicine: take the entire course of medicine even if you are feeling better. Stopping early can cause the antibiotic not to work.  Take any prescribed medications only as directed. Treatment for your infection is aimed at treating the symptoms. There are no medications, such as antibiotics, that will cure your infection.   Home care instructions:  You can take Tylenol and/or Ibuprofen as directed on the packaging for fever reduction and pain relief.    For cough: honey 1/2 to 1 teaspoon (you can dilute the honey in water or another fluid).  You can also use guaifenesin and dextromethorphan for cough. You can use a humidifier for chest congestion and cough.  If you don't have a humidifier, you can sit in the bathroom with the hot shower running.      For sore throat: try warm salt water gargles, cepacol lozenges, throat spray, warm tea or water with lemon/honey, popsicles or ice, or OTC cold relief medicine for throat discomfort.    For congestion: take a daily anti-histamine like Zyrtec, Claritin, and a oral decongestant, such as pseudoephedrine.  You can also use Flonase 1-2 sprays in each nostril daily.    It is important to stay hydrated: drink plenty of fluids (water, gatorade/powerade/pedialyte, juices, or teas) to keep your throat moisturized and help further relieve irritation/discomfort.   Your illness is contagious and can be spread to others, especially during the first 3 or 4  days. It cannot be cured by antibiotics or other medicines. Take basic precautions such as washing your hands often, covering your mouth when you cough or sneeze, and avoiding public places where you could spread your illness to others.   Please continue drinking plenty of fluids.  Use over-the-counter medicines as needed as directed on packaging for symptom relief.  You may also use ibuprofen or tylenol as directed on packaging for pain or fever.  Do not take multiple medicines containing Tylenol or acetaminophen to avoid taking too much of this medication.  Follow-up instructions: Please follow-up with your primary care provider in the next 3 days for further evaluation of your symptoms if you are not feeling better.   Return instructions:  Please return to the Emergency Department if you experience worsening symptoms.  RETURN IMMEDIATELY IF you develop shortness of breath, confusion or altered mental status, a new rash, become dizzy, faint, or poorly responsive, or are unable to be cared for at home. Please return if you have persistent vomiting and cannot keep down fluids or develop a fever that is not controlled by tylenol or motrin.   Please return if you have any other emergent concerns.  Additional Information:  Your vital signs today were: BP (!) 167/106   Pulse 75   Temp 98.2 F (36.8 C)   Resp 20   SpO2 94%  If your blood pressure (BP) was elevated above 135/85 this visit, please have this repeated by your doctor within one month. --------------

## 2022-09-12 NOTE — ED Provider Notes (Signed)
[place] EMERGENCY DEPARTMENT AT [facility] Provider Note   CSN: [id number] Arrival date & time: 09/12/22  1214     History  No chief complaint on file.   Monique Lindsey is a 47 y.o. female.  Patient present to the emergency department for 4 to 5 days of sinus pressure, nasal congestion, hoarse voice, and recurrent nosebleeds.  Patient states that she has been having seasonal allergy symptoms for longer than that.  She does take an antihistamine for this.  Since her symptoms worsen she has tried multiple over-the-counter treatments including NSAIDs, Mucinex, Alka-Seltzer cold medication.  She has gotten some relief from this.  She however has had recurrent nosebleeds and feels very fatigued.  She does not have a history of significant anemia and has never required a blood transfusion.  No anticoagulants.  She has had a couple of episodes of vomiting and a productive cough as well.  She has had subjective fevers.       Home Medications Prior to Admission medications   Medication Sig Start Date End Date Taking? Authorizing Provider  amoxicillin (AMOXIL) 500 MG capsule Take 1 capsule (500 mg total) by mouth 3 (three) times daily. 09/12/22  Yes Renne Crigler, PA-C  fluticasone (FLONASE) 50 MCG/ACT nasal spray Place 2 sprays into both nostrils daily. 09/12/22  Yes Renne Crigler, PA-C  ibuprofen (ADVIL,MOTRIN) 200 MG tablet Take 4 tablets (800 mg total) by mouth 3 (three) times daily. 04/16/14   Brayton El, PA-C  meloxicam (MOBIC) 15 MG tablet Take 1 tablet (15 mg total) by mouth daily. 03/24/17   Park Liter, DPM  meloxicam (MOBIC) 15 MG tablet Take 1 tablet (15 mg total) by mouth daily. 03/24/17   Park Liter, DPM      Allergies    Patient has no known allergies.    Review of Systems   Review of Systems  Physical Exam Updated Vital Signs BP (!) 167/106   Pulse 75   Temp 98.2 F (36.8 C)   Resp 20   SpO2 94%   Physical Exam Vitals and nursing note  reviewed.  Constitutional:      Appearance: She is well-developed.  HENT:     Head: Normocephalic and atraumatic.     Jaw: No trismus.     Right Ear: Tympanic membrane, ear canal and external ear normal.     Left Ear: Tympanic membrane, ear canal and external ear normal.     Nose: Mucosal edema and congestion present. No rhinorrhea.     Right Nostril: No epistaxis or occlusion.     Left Nostril: No epistaxis or occlusion.     Right Turbinates: Swollen.     Left Turbinates: Swollen.     Right Sinus: Frontal sinus tenderness present. No maxillary sinus tenderness.     Left Sinus: Frontal sinus tenderness present. No maxillary sinus tenderness.     Comments: Mucosal swelling and inflammation, left greater than right nare    Mouth/Throat:     Mouth: Mucous membranes are moist. Mucous membranes are not dry. No oral lesions.     Pharynx: Uvula midline. No oropharyngeal exudate, posterior oropharyngeal erythema or uvula swelling.     Tonsils: No tonsillar abscesses.     Comments: Voice is hoarse, she is tolerating secretions Eyes:     General:        Right eye: No discharge.        Left eye: No discharge.     Conjunctiva/sclera: Conjunctivae normal.  Cardiovascular:  Rate and Rhythm: Normal rate and regular rhythm.     Heart sounds: Normal heart sounds.  Pulmonary:     Effort: Pulmonary effort is normal. No respiratory distress.     Breath sounds: Normal breath sounds. No wheezing or rales.  Abdominal:     Palpations: Abdomen is soft.     Tenderness: There is no abdominal tenderness.  Musculoskeletal:     Cervical back: Normal range of motion and neck supple.  Lymphadenopathy:     Cervical: No cervical adenopathy.  Skin:    General: Skin is warm and dry.  Neurological:     Mental Status: She is alert.  Psychiatric:        Mood and Affect: Mood normal.     ED Results / Procedures / Treatments   Labs (all labs ordered are listed, but only abnormal results are  displayed) Labs Reviewed  URINALYSIS, ROUTINE W REFLEX MICROSCOPIC - Abnormal; Notable for the following components:      Result Value   Hgb urine dipstick SMALL (*)    Leukocytes,Ua TRACE (*)    All other components within normal limits  SARS CORONAVIRUS 2 BY RT PCR    EKG None  Radiology No results found.  Procedures Procedures    Medications Ordered in ED Medications - No data to display  ED Course/ Medical Decision Making/ A&P    Patient seen and examined. History obtained directly from patient. Work-up including labs, imaging, EKG ordered in triage, if performed, were reviewed.    Labs/EKG: Ordered in triage.  Independently reviewed and interpreted.  This included: UA unremarkable, COVID test pending.  Imaging: None ordered  Medications/Fluids: None ordered  Most recent vital signs reviewed and are as follows: BP (!) 167/106   Pulse 75   Temp 98.2 F (36.8 C)   Resp 20   SpO2 94%   Initial impression: Sinusitis, laryngitis  Home treatment plan: Continued OTC meds, will give prescription for amoxicillin for sinusitis and Flonase.  Return instructions discussed with patient: Return with worsening fevers, shortness of breath, new or other concerns.  Follow-up instructions discussed with patient: Follow-up with PCP in 5 days if not improving                            Medical Decision Making Amount and/or Complexity of Data Reviewed Labs: ordered.  Risk Prescription drug management.   Patient with sinusitis and URI symptoms.  She has associated laryngitis but is handling her secretions well and I do not suspect epiglottitis or deep space infection in the neck.  She has had nosebleeds which is likely related to underlying rhinitis/sinusitis.  No active bleeding.  Nasal mucosa are generally inflamed.  Low concern for strep throat.  COVID testing pending, patient be notified with positive result.        Final Clinical Impression(s) / ED  Diagnoses Final diagnoses:  Acute non-recurrent sinusitis, unspecified location  Laryngitis  Epistaxis    Rx / DC Orders ED Discharge Orders          Ordered    amoxicillin (AMOXIL) 500 MG capsule  3 times daily        09/12/22 1302    fluticasone (FLONASE) 50 MCG/ACT nasal spray  Daily        09/12/22 1302              Renne Crigler, PA-C 09/12/22 1309    Horton, Clabe Seal, DO 09/12/22 1358

## 2022-09-12 NOTE — ED Triage Notes (Signed)
Congestion ,drainage, hoarse voice. Feels "feverish", bloody noses multiple times,feeling tired.

## 2023-05-27 ENCOUNTER — Other Ambulatory Visit: Payer: Self-pay | Admitting: Obstetrics

## 2023-05-27 DIAGNOSIS — R928 Other abnormal and inconclusive findings on diagnostic imaging of breast: Secondary | ICD-10-CM

## 2023-05-30 ENCOUNTER — Ambulatory Visit
Admission: RE | Admit: 2023-05-30 | Discharge: 2023-05-30 | Disposition: A | Discharge status: Home / Self Care | Payer: BC Managed Care – PPO | Source: Ambulatory Visit | Attending: Obstetrics | Admitting: Obstetrics

## 2023-05-30 ENCOUNTER — Other Ambulatory Visit: Payer: Self-pay | Admitting: Obstetrics

## 2023-05-30 DIAGNOSIS — R928 Other abnormal and inconclusive findings on diagnostic imaging of breast: Secondary | ICD-10-CM

## 2023-05-30 DIAGNOSIS — N6320 Unspecified lump in the left breast, unspecified quadrant: Secondary | ICD-10-CM

## 2023-12-05 ENCOUNTER — Other Ambulatory Visit: Payer: BC Managed Care – PPO

## 2023-12-28 ENCOUNTER — Ambulatory Visit
Admission: RE | Admit: 2023-12-28 | Discharge: 2023-12-28 | Disposition: A | Discharge status: Home / Self Care | Source: Ambulatory Visit | Attending: Obstetrics | Admitting: Obstetrics

## 2023-12-28 DIAGNOSIS — N6320 Unspecified lump in the left breast, unspecified quadrant: Secondary | ICD-10-CM

## 2023-12-28 DIAGNOSIS — R928 Other abnormal and inconclusive findings on diagnostic imaging of breast: Secondary | ICD-10-CM

## 2023-12-29 ENCOUNTER — Other Ambulatory Visit: Payer: Self-pay | Admitting: Obstetrics

## 2023-12-29 DIAGNOSIS — N6320 Unspecified lump in the left breast, unspecified quadrant: Secondary | ICD-10-CM

## 2024-01-06 ENCOUNTER — Ambulatory Visit
Admission: RE | Admit: 2024-01-06 | Discharge: 2024-01-06 | Disposition: A | Discharge status: Home / Self Care | Source: Ambulatory Visit | Attending: Obstetrics | Admitting: Obstetrics

## 2024-01-06 ENCOUNTER — Other Ambulatory Visit

## 2024-01-06 DIAGNOSIS — N6320 Unspecified lump in the left breast, unspecified quadrant: Secondary | ICD-10-CM

## 2024-01-06 HISTORY — PX: BREAST BIOPSY: SHX20

## 2024-01-09 LAB — SURGICAL PATHOLOGY
